# Patient Record
Sex: Female | Born: 1985 | Race: White | Hispanic: Yes | Marital: Married | Smoking: Never smoker
Health system: Southern US, Community
[De-identification: ages and names within clinical notes are randomized; demographics above are authoritative.]

## PROBLEM LIST (undated history)

## (undated) DIAGNOSIS — K828 Other specified diseases of gallbladder: Secondary | ICD-10-CM

## (undated) DIAGNOSIS — A749 Chlamydial infection, unspecified: Secondary | ICD-10-CM

## (undated) HISTORY — DX: Other specified diseases of gallbladder: K82.8

## (undated) HISTORY — PX: NO PAST SURGERIES: SHX2092

## (undated) HISTORY — DX: Chlamydial infection, unspecified: A74.9

---
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
## 2008-03-02 ENCOUNTER — Emergency Department (HOSPITAL_COMMUNITY): Admission: EM | Admit: 2008-03-02 | Discharge: 2008-03-02 | Payer: Self-pay | Admitting: Internal Medicine

## 2008-03-03 ENCOUNTER — Emergency Department (HOSPITAL_COMMUNITY): Admission: EM | Admit: 2008-03-03 | Discharge: 2008-03-03 | Payer: Self-pay | Admitting: Emergency Medicine

## 2010-06-18 HISTORY — PX: OTHER SURGICAL HISTORY: SHX169

## 2010-06-18 NOTE — L&D Delivery Note (Signed)
Delivery Note At 3:11 AM a viable female was delivered via Vaginal, Spontaneous Delivery (Presentation:LOA ;  ).  APGAR: , ; weight .   Placenta status Spont via shultz: , .  Cord: 3VC.  Anesthesia: NONE  Episiotomy: NONE Lacerations: NONE Suture Repair:  Est. Blood Loss350 (mL):   Mom to postpartum.  Baby to nursery-stable.  Zerita Boers 02/20/2011, 3:19 AM

## 2010-09-11 ENCOUNTER — Emergency Department (HOSPITAL_COMMUNITY)
Admission: EM | Admit: 2010-09-11 | Discharge: 2010-09-11 | Disposition: A | Discharge status: Home / Self Care | Payer: Medicaid Other | Attending: Emergency Medicine | Admitting: Emergency Medicine

## 2010-09-11 ENCOUNTER — Emergency Department (HOSPITAL_COMMUNITY): Payer: Medicaid Other

## 2010-09-11 DIAGNOSIS — B9689 Other specified bacterial agents as the cause of diseases classified elsewhere: Secondary | ICD-10-CM | POA: Insufficient documentation

## 2010-09-11 DIAGNOSIS — O2390 Unspecified genitourinary tract infection in pregnancy, unspecified trimester: Secondary | ICD-10-CM | POA: Insufficient documentation

## 2010-09-11 DIAGNOSIS — A499 Bacterial infection, unspecified: Secondary | ICD-10-CM | POA: Insufficient documentation

## 2010-09-11 DIAGNOSIS — N760 Acute vaginitis: Secondary | ICD-10-CM | POA: Insufficient documentation

## 2010-09-11 DIAGNOSIS — R109 Unspecified abdominal pain: Secondary | ICD-10-CM | POA: Insufficient documentation

## 2010-09-11 LAB — URINALYSIS, ROUTINE W REFLEX MICROSCOPIC
Protein, ur: NEGATIVE mg/dL
Specific Gravity, Urine: 1.027 (ref 1.005–1.030)
Urobilinogen, UA: 0.2 mg/dL (ref 0.0–1.0)

## 2010-09-11 LAB — WET PREP, GENITAL
Trich, Wet Prep: NONE SEEN
Yeast Wet Prep HPF POC: NONE SEEN

## 2010-09-12 LAB — GC/CHLAMYDIA PROBE AMP, GENITAL
Chlamydia, DNA Probe: NEGATIVE
GC Probe Amp, Genital: NEGATIVE

## 2010-09-12 LAB — URINE CULTURE

## 2010-09-17 ENCOUNTER — Inpatient Hospital Stay (HOSPITAL_COMMUNITY)
Admission: AD | Admit: 2010-09-17 | Payer: Medicaid Other | Source: Ambulatory Visit | Admitting: Obstetrics and Gynecology

## 2010-09-20 ENCOUNTER — Other Ambulatory Visit: Payer: Self-pay | Admitting: Family Medicine

## 2010-09-20 DIAGNOSIS — O0930 Supervision of pregnancy with insufficient antenatal care, unspecified trimester: Secondary | ICD-10-CM

## 2010-09-20 DIAGNOSIS — Z1389 Encounter for screening for other disorder: Secondary | ICD-10-CM

## 2010-09-20 LAB — ANTIBODY SCREEN: Antibody Screen: NEGATIVE

## 2010-09-20 LAB — RPR: RPR: NONREACTIVE

## 2010-09-20 LAB — ABO/RH

## 2010-09-20 LAB — RUBELLA ANTIBODY, IGM: Rubella: IMMUNE

## 2010-09-21 ENCOUNTER — Other Ambulatory Visit: Payer: Self-pay | Admitting: Family Medicine

## 2010-09-21 ENCOUNTER — Ambulatory Visit (HOSPITAL_COMMUNITY)
Admission: RE | Admit: 2010-09-21 | Discharge: 2010-09-21 | Disposition: A | Discharge status: Home / Self Care | Payer: Medicaid Other | Source: Ambulatory Visit | Attending: Family Medicine | Admitting: Family Medicine

## 2010-09-21 DIAGNOSIS — Z1389 Encounter for screening for other disorder: Secondary | ICD-10-CM

## 2010-09-21 DIAGNOSIS — Z3689 Encounter for other specified antenatal screening: Secondary | ICD-10-CM | POA: Insufficient documentation

## 2010-09-21 DIAGNOSIS — O0930 Supervision of pregnancy with insufficient antenatal care, unspecified trimester: Secondary | ICD-10-CM | POA: Insufficient documentation

## 2010-11-21 ENCOUNTER — Ambulatory Visit: Payer: Medicaid Other | Admitting: Gastroenterology

## 2010-12-05 ENCOUNTER — Ambulatory Visit (INDEPENDENT_AMBULATORY_CARE_PROVIDER_SITE_OTHER): Payer: Self-pay | Admitting: Gastroenterology

## 2010-12-05 ENCOUNTER — Encounter: Payer: Self-pay | Admitting: Gastroenterology

## 2010-12-05 ENCOUNTER — Other Ambulatory Visit (INDEPENDENT_AMBULATORY_CARE_PROVIDER_SITE_OTHER): Payer: Self-pay

## 2010-12-05 VITALS — BP 124/68 | HR 88 | Ht 62.0 in | Wt 151.0 lb

## 2010-12-05 DIAGNOSIS — R7989 Other specified abnormal findings of blood chemistry: Secondary | ICD-10-CM

## 2010-12-05 LAB — COMPREHENSIVE METABOLIC PANEL
ALT: 30 U/L (ref 0–35)
AST: 22 U/L (ref 0–37)
Albumin: 3.3 g/dL — ABNORMAL LOW (ref 3.5–5.2)
Alkaline Phosphatase: 88 U/L (ref 39–117)
BUN: 8 mg/dL (ref 6–23)
Calcium: 9 mg/dL (ref 8.4–10.5)
Chloride: 106 mEq/L (ref 96–112)
Creatinine, Ser: 0.3 mg/dL — ABNORMAL LOW (ref 0.4–1.2)
Glucose, Bld: 72 mg/dL (ref 70–99)
Total Bilirubin: 0.1 mg/dL — ABNORMAL LOW (ref 0.3–1.2)

## 2010-12-05 LAB — CBC WITH DIFFERENTIAL/PLATELET
Basophils Absolute: 0 10*3/uL (ref 0.0–0.1)
Eosinophils Absolute: 0.3 10*3/uL (ref 0.0–0.7)
HCT: 31 % — ABNORMAL LOW (ref 36.0–46.0)
Hemoglobin: 10.9 g/dL — ABNORMAL LOW (ref 12.0–15.0)
Lymphs Abs: 1.7 10*3/uL (ref 0.7–4.0)
MCHC: 35 g/dL (ref 30.0–36.0)
MCV: 91.4 fl (ref 78.0–100.0)
Monocytes Absolute: 0.5 10*3/uL (ref 0.1–1.0)
Monocytes Relative: 5.2 % (ref 3.0–12.0)
Neutro Abs: 7.1 10*3/uL (ref 1.4–7.7)
Platelets: 248 10*3/uL (ref 150.0–400.0)
RDW: 13.2 % (ref 11.5–14.6)

## 2010-12-05 NOTE — Patient Instructions (Addendum)
You will be set up for an ultrasound.  [facility]  12/08/10  930 am arrive 915 am nothing to eat or drink after midnight. You will get labs drawn today:  Hepatitis A (IgM and IgG), total iron, ferritin, TIBC, ANA, AMA, alphafeto protein (AFP), anti smooth muscle antibody, alpha 1 antitrypsin, cerulloplasm, CBC, CMET, INR. Will probably need to follow liver tests every 3-4 works during pregnancy. A copy of this information will be made available to Dr. Shawnie Pons. Will look into your 2009 hepatitis episode for more details.

## 2010-12-05 NOTE — Progress Notes (Signed)
HPI: This is a  very pleasant 25 year old woman who is here for evaluation of elevated transaminases.  AST 47 ALT 103 10/12/2010  Second pregnancy, currently 28 weeks.  Unknown sex.  In 2009, she went to ER with right sided pains.  Very elevated liver tests, transaminases both in the 500s..  US showed GB sludge.  Since then no problems.  She never had dedicated liver follow up after that 2009 episode.  She thinks she had hep C back then.  I reviewed her emergency room visits and I cannot see any notation where hepatitis viral serologies were checked.   No problems during this pregnancy.    preg 2006 baby girl, turning 6 soon.  No liver troubles in family.   Used to drink a lot on weekends, no in a very long time. Was no an alcoholic.  Review of systems: Pertinent positive and negative review of systems were noted in the above HPI section.  All other review of systems was otherwise negative.   Past Medical History  Diagnosis Date  . Gallbladder sludge     No past surgical history on file.   reports that she has quit smoking. She has never used smokeless tobacco. She reports that she does not drink alcohol or use illicit drugs.  family history includes Diabetes in her sister.  There is no history of Colon cancer.    Current Medications, Allergies were all reviewed with the patient via [facility] electronic medical record system.    Physical Exam: BP 124/68  Pulse 88  Ht 5\' 2"  (1.575 m)  Wt 151 lb (68.493 kg)  BMI 27.62 kg/m2 Constitutional: generally well-appearing Psychiatric: alert and oriented x3 Eyes: extraocular movements intact Mouth: oral pharynx moist, no lesions Neck: supple no lymphadenopathy Cardiovascular: heart regular rate and rhythm Lungs: clear to auscultation bilaterally Abdomen: soft, nontender, nondistended, no obvious ascites, no peritoneal signs, normal bowel sounds Extremities: no lower extremity edema bilaterally Skin: no lesions on visible  extremities    Assessment and plan: 25 y.o. female with history of hepatitis, currently slightly elevated liver tests while pregnant  Clinically she feels fine, she has no biliary symptoms or hepatitis symptoms. Her liver tests are only very minor early elevated as of 6 weeks ago. We will recheck those today and will also order a battery of other tests to determine the cause of her elevated liver tests. It is not clear to me what type of hepatitis she had in 2009. I see no documentation of hepatitis a, hepatitis C testing. She did have hepatitis B. surface antigen checked at her gynecologist and it was negative. She will also get a new abdominal ultrasound.

## 2010-12-06 LAB — FERRITIN: Ferritin: 8.7 ng/mL — ABNORMAL LOW (ref 10.0–291.0)

## 2010-12-06 LAB — ALPHA-1-ANTITRYPSIN: A-1 Antitrypsin, Ser: 229 mg/dL — ABNORMAL HIGH (ref 90–200)

## 2010-12-06 LAB — CERULOPLASMIN: Ceruloplasmin: 57 mg/dL (ref 20–60)

## 2010-12-08 ENCOUNTER — Other Ambulatory Visit (HOSPITAL_COMMUNITY): Payer: Self-pay

## 2010-12-11 ENCOUNTER — Telehealth: Payer: Self-pay

## 2010-12-11 NOTE — Telephone Encounter (Signed)
Unable to reach pt by phone letter mailed.  

## 2010-12-11 NOTE — Telephone Encounter (Signed)
Message copied by Donata Duff on Mon Dec 11, 2010  9:12 AM ------      Message from: Rob Bunting P      Created: Thu Dec 07, 2010  9:25 AM                   Please call the patient.  The labs were all essentially normal. (liver tests now normal).  Needs repeat LFTs in 2 weeks.

## 2010-12-12 ENCOUNTER — Ambulatory Visit (HOSPITAL_COMMUNITY)
Admission: RE | Admit: 2010-12-12 | Discharge: 2010-12-12 | Disposition: A | Discharge status: Home / Self Care | Payer: Self-pay | Source: Ambulatory Visit | Attending: Gastroenterology | Admitting: Gastroenterology

## 2010-12-12 DIAGNOSIS — R7989 Other specified abnormal findings of blood chemistry: Secondary | ICD-10-CM | POA: Insufficient documentation

## 2010-12-12 DIAGNOSIS — O99891 Other specified diseases and conditions complicating pregnancy: Secondary | ICD-10-CM | POA: Insufficient documentation

## 2011-01-25 LAB — STREP B DNA PROBE: GBS: NEGATIVE

## 2011-02-13 ENCOUNTER — Telehealth: Payer: Self-pay | Admitting: Gastroenterology

## 2011-02-13 ENCOUNTER — Encounter: Payer: Self-pay | Admitting: Gastroenterology

## 2011-02-13 NOTE — Telephone Encounter (Signed)
Error

## 2011-02-13 NOTE — Telephone Encounter (Signed)
Pt was called and I was instructed the phone number was not the correct number for the pt.

## 2011-02-20 ENCOUNTER — Inpatient Hospital Stay (HOSPITAL_COMMUNITY)
Admission: AD | Admit: 2011-02-20 | Discharge: 2011-02-21 | DRG: 775 | Disposition: A | Discharge status: Home / Self Care | Payer: Medicaid Other | Source: Ambulatory Visit | Attending: Obstetrics & Gynecology | Admitting: Obstetrics & Gynecology

## 2011-02-20 ENCOUNTER — Encounter (HOSPITAL_COMMUNITY): Payer: Self-pay | Admitting: *Deleted

## 2011-02-20 LAB — CBC
HCT: 36.3 % (ref 36.0–46.0)
Hemoglobin: 12.1 g/dL (ref 12.0–15.0)
RBC: 4.03 MIL/uL (ref 3.87–5.11)
RDW: 13.7 % (ref 11.5–15.5)
WBC: 8.5 10*3/uL (ref 4.0–10.5)

## 2011-02-20 MED ORDER — OXYCODONE-ACETAMINOPHEN 5-325 MG PO TABS
1.0000 | ORAL_TABLET | ORAL | Status: DC | PRN
  Filled 2011-02-20: qty 2

## 2011-02-20 MED ORDER — OXYTOCIN BOLUS FROM INFUSION
500.0000 mL | Freq: Once | INTRAVENOUS | Status: DC
  Filled 2011-02-20: qty 500
  Filled 2011-02-20: qty 1000

## 2011-02-20 MED ORDER — OXYTOCIN 20 UNITS IN LACTATED RINGERS INFUSION - SIMPLE
125.0000 mL/h | Freq: Once | INTRAVENOUS | Status: DC
  Administered 2011-02-20: 999 mL/h via INTRAVENOUS

## 2011-02-20 MED ORDER — ZOLPIDEM TARTRATE 5 MG PO TABS: 5.0000 mg | ORAL_TABLET | Freq: Every evening | ORAL | Status: DC | PRN

## 2011-02-20 MED ORDER — DIPHENHYDRAMINE HCL 25 MG PO CAPS: 25.0000 mg | ORAL_CAPSULE | Freq: Four times a day (QID) | ORAL | Status: DC | PRN

## 2011-02-20 MED ORDER — LIDOCAINE HCL (PF) 1 % IJ SOLN
30.0000 mL | INTRAMUSCULAR | Status: DC | PRN
  Filled 2011-02-20: qty 30

## 2011-02-20 MED ORDER — BENZOCAINE-MENTHOL 20-0.5 % EX AERO: 1.0000 "application " | INHALATION_SPRAY | CUTANEOUS | Status: DC | PRN

## 2011-02-20 MED ORDER — ACETAMINOPHEN 325 MG PO TABS: 650.0000 mg | ORAL_TABLET | ORAL | Status: DC | PRN

## 2011-02-20 MED ORDER — PRENATAL PLUS 27-1 MG PO TABS
1.0000 | ORAL_TABLET | Freq: Every day | ORAL | Status: DC
  Administered 2011-02-20 – 2011-02-21 (×2): 1 via ORAL
  Filled 2011-02-20 (×2): qty 1

## 2011-02-20 MED ORDER — CITRIC ACID-SODIUM CITRATE 334-500 MG/5ML PO SOLN: 30.0000 mL | ORAL | Status: DC | PRN

## 2011-02-20 MED ORDER — FLEET ENEMA 7-19 GM/118ML RE ENEM: 1.0000 | ENEMA | RECTAL | Status: DC | PRN

## 2011-02-20 MED ORDER — SENNOSIDES-DOCUSATE SODIUM 8.6-50 MG PO TABS
2.0000 | ORAL_TABLET | Freq: Every day | ORAL | Status: DC
  Administered 2011-02-20: 2 via ORAL

## 2011-02-20 MED ORDER — NALBUPHINE SYRINGE 5 MG/0.5 ML: 10.0000 mg | INJECTION | INTRAMUSCULAR | Status: DC | PRN

## 2011-02-20 MED ORDER — LACTATED RINGERS IV SOLN: 500.0000 mL | INTRAVENOUS | Status: DC | PRN

## 2011-02-20 MED ORDER — IBUPROFEN 600 MG PO TABS: 600.0000 mg | ORAL_TABLET | Freq: Four times a day (QID) | ORAL | Status: DC | PRN

## 2011-02-20 MED ORDER — TETANUS-DIPHTH-ACELL PERTUSSIS 5-2.5-18.5 LF-MCG/0.5 IM SUSP
0.5000 mL | Freq: Once | INTRAMUSCULAR | Status: AC
  Administered 2011-02-21: 0.5 mL via INTRAMUSCULAR
  Filled 2011-02-20: qty 0.5

## 2011-02-20 MED ORDER — OXYCODONE-ACETAMINOPHEN 5-325 MG PO TABS
2.0000 | ORAL_TABLET | ORAL | Status: DC | PRN
Start: 2011-02-20 — End: 2011-02-20

## 2011-02-20 MED ORDER — PRE-NATAL FORMULA PO TABS: ORAL_TABLET | Freq: Every day | ORAL | Status: DC

## 2011-02-20 MED ORDER — WITCH HAZEL-GLYCERIN EX PADS: 1.0000 "application " | MEDICATED_PAD | CUTANEOUS | Status: DC | PRN

## 2011-02-20 MED ORDER — ONDANSETRON HCL 4 MG PO TABS: 4.0000 mg | ORAL_TABLET | ORAL | Status: DC | PRN

## 2011-02-20 MED ORDER — ONDANSETRON HCL 4 MG/2ML IJ SOLN: 4.0000 mg | Freq: Four times a day (QID) | INTRAMUSCULAR | Status: DC | PRN

## 2011-02-20 MED ORDER — IBUPROFEN 600 MG PO TABS
600.0000 mg | ORAL_TABLET | Freq: Four times a day (QID) | ORAL | Status: DC
  Administered 2011-02-20 – 2011-02-21 (×6): 600 mg via ORAL
  Filled 2011-02-20 (×8): qty 1

## 2011-02-20 MED ORDER — LACTATED RINGERS IV SOLN
INTRAVENOUS | Status: DC
  Administered 2011-02-20: 03:00:00 via INTRAVENOUS

## 2011-02-20 MED ORDER — LANOLIN HYDROUS EX OINT: TOPICAL_OINTMENT | CUTANEOUS | Status: DC | PRN

## 2011-02-20 MED ORDER — SIMETHICONE 80 MG PO CHEW: 80.0000 mg | CHEWABLE_TABLET | ORAL | Status: DC | PRN

## 2011-02-20 MED ORDER — DIBUCAINE 1 % RE OINT: 1.0000 "application " | TOPICAL_OINTMENT | RECTAL | Status: DC | PRN

## 2011-02-20 MED ORDER — ONDANSETRON HCL 4 MG/2ML IJ SOLN: 4.0000 mg | INTRAMUSCULAR | Status: DC | PRN

## 2011-02-20 NOTE — Progress Notes (Signed)
Pt reports contractions q 10 minutes, blood on tissue when she wipes

## 2011-02-20 NOTE — H&P (Signed)
Mandee Pluta Ruiz-Garcia is a 25 y.o. female presenting for labor. Maternal Medical History:  Reason for admission: Reason for admission: contractions.  Contractions: Onset was 3-5 hours ago.   Frequency: regular.   Perceived severity is strong.    Fetal activity: Perceived fetal activity is normal.   Last perceived fetal movement was within the past hour.    Prenatal complications: no prenatal complications   OB History    Grav Para Term Preterm Abortions TAB SAB Ect Mult Living   2 1        1      Past Medical History  Diagnosis Date  . Gallbladder sludge    History reviewed. No pertinent past surgical history. Family History: family history includes Diabetes in her sister.  There is no history of Colon cancer. Social History:  reports that she has never smoked. She has never used smokeless tobacco. She reports that she does not drink alcohol or use illicit drugs.  Review of Systems  All other systems reviewed and are negative.    Dilation: 5 Effacement (%): 90 Station: -1 Exam by:: Weston,RN Blood pressure 124/85, pulse 84, temperature 97.9 F (36.6 C), temperature source Oral, resp. rate 18, height 5\' 5"  (1.651 m), weight 71.215 kg (157 lb). Maternal Exam:  Uterine Assessment: Contraction strength is firm.  Contraction frequency is regular.   Abdomen: Patient reports no abdominal tenderness. Fundal height is 8lbs.   Fetal presentation: vertex  Introitus: Normal vulva. Normal vagina.    Physical Exam  Vitals reviewed. Constitutional: She is oriented to person, place, and time. She appears well-developed and well-nourished.  HENT:  Head: Normocephalic.  Neck: Normal range of motion.  Cardiovascular: Normal rate, regular rhythm, normal heart sounds and intact distal pulses.   Respiratory: Effort normal and breath sounds normal.  GI: Soft. Bowel sounds are normal.  Genitourinary: Vagina normal.  Musculoskeletal: Normal range of motion.  Neurological: She is alert and  oriented to person, place, and time. She has normal reflexes.  Skin: Skin is warm and dry.  Psychiatric: She has a normal mood and affect. Her behavior is normal. Judgment and thought content normal.    Prenatal labs: ABO, Rh: B/Positive/-- (04/04 0000) Antibody: Negative (04/04 0000) Rubella:   RPR: Nonreactive (04/04 0000)  HBsAg: Negative (04/04 0000)  HIV: Non-reactive (06/14 0000)  GBS: Negative (08/09 0000)   Assessment/Plan: Admit anticipate vag delivery.   Zerita Boers 02/20/2011, 2:24 AM

## 2011-02-20 NOTE — Progress Notes (Signed)
UR Chart review completed.  

## 2011-02-21 MED ORDER — IBUPROFEN 600 MG PO TABS: 600.0000 mg | ORAL_TABLET | Freq: Four times a day (QID) | ORAL | Status: AC

## 2011-02-21 MED ORDER — LANOLIN HYDROUS EX OINT: 1.0000 "application " | TOPICAL_OINTMENT | CUTANEOUS | Status: DC | PRN

## 2011-02-21 MED ORDER — NORETHINDRONE 0.35 MG PO TABS: 1.0000 | ORAL_TABLET | Freq: Every day | ORAL | Status: DC

## 2011-02-21 NOTE — Discharge Summary (Signed)
Obstetric Discharge Summary Reason for Admission: onset of labor Prenatal Procedures: none Intrapartum Procedures: spontaneous vaginal delivery Postpartum Procedures: none Complications-Operative and Postpartum: none Hemoglobin  Date Value Range Status  02/20/2011 12.1  12.0-15.0 (g/dL) Final     HCT  Date Value Range Status  02/20/2011 36.3  36.0-46.0 (%) Final    Discharge Diagnoses: Term Pregnancy-delivered  Discharge Information: Date: 02/21/2011 Activity: pelvic rest Diet: routine Medications: PNV and Ortho micronor Condition: stable Instructions: refer to practice specific booklet Discharge to: home Follow-up Information    Follow up with [facility]. Call in 6 weeks.         Newborn Data: Live born female  Birth Weight: 7 lb 12.2 oz (3521 g) APGAR: 9, 9  Home with mother.  Jayland Null 02/21/2011, 2:32 PM

## 2011-02-21 NOTE — Progress Notes (Signed)
  Subjective:    Patient ID: Summer Wilkinson, female    DOB: Feb 16, 1986, 25 y.o.   MRN: [id number]  HPI    Review of Systems     Objective:   Physical Exam        Assessment & Plan:   Post Partum Day 1 Subjective: no complaints  Objective: Blood pressure 107/66, pulse 73, temperature 97.3 F (36.3 C), temperature source Oral, resp. rate 19, height 5\' 5"  (1.651 m), weight 157 lb (71.215 kg), SpO2 97.00%, unknown if currently breastfeeding.  Physical Exam:  General: alert and no distress Lochia: appropriate Uterine Fundus: firm Incision: n/a DVT Evaluation: No evidence of DVT seen on physical exam. No significant calf/ankle edema.   Basename 02/20/11 0239  HGB 12.1  HCT 36.3    Assessment/Plan: Plan for discharge tomorrow or this afternoon if baby cleared for early discharge, Breastfeeding and Contraception ortho micronor.    LOS: 1 day   Mirranda Monrroy 02/21/2011, 9:13 AM

## 2011-02-26 NOTE — H&P (Signed)
Attestation of Attending Supervision of Advanced Practitioner: Evaluation and management procedures were performed by the PA/NP/CNM/OB Fellow under my supervision/collaboration. Chart reviewed and agree with management and plan.  Bartosz Luginbill A 02/26/2011 9:54 PM   

## 2011-03-19 LAB — COMPREHENSIVE METABOLIC PANEL
ALT: 598 — ABNORMAL HIGH
AST: 517 — ABNORMAL HIGH
Albumin: 4.1
Calcium: 9.3
GFR calc Af Amer: >60
Potassium: 4
Sodium: 140
Total Protein: 6.6

## 2011-03-19 LAB — URINE MICROSCOPIC-ADD ON

## 2011-03-19 LAB — CBC
MCHC: 33.6
Platelets: 257
RDW: 13.8

## 2011-03-19 LAB — URINALYSIS, ROUTINE W REFLEX MICROSCOPIC
Bilirubin Urine: NEGATIVE
Nitrite: NEGATIVE
Protein, ur: 100 — AB
Specific Gravity, Urine: 1.028
Urobilinogen, UA: 1

## 2011-03-19 LAB — DIFFERENTIAL
Basophils Absolute: 0
Basophils Relative: 1
Lymphocytes Relative: 31
Neutro Abs: 2.5
Neutrophils Relative %: 49

## 2011-03-19 LAB — LIPASE, BLOOD: Lipase: 41

## 2011-03-19 LAB — POCT PREGNANCY, URINE: Preg Test, Ur: NEGATIVE

## 2011-03-19 LAB — HEPATITIS B SURFACE ANTIGEN: Hepatitis B Surface Ag: NEGATIVE

## 2011-07-22 ENCOUNTER — Encounter (HOSPITAL_COMMUNITY): Payer: Self-pay | Admitting: Emergency Medicine

## 2011-07-22 ENCOUNTER — Emergency Department (HOSPITAL_COMMUNITY)
Admission: EM | Admit: 2011-07-22 | Discharge: 2011-07-22 | Disposition: A | Discharge status: Home / Self Care | Payer: Self-pay | Attending: Emergency Medicine | Admitting: Emergency Medicine

## 2011-07-22 ENCOUNTER — Emergency Department (HOSPITAL_COMMUNITY): Payer: Self-pay

## 2011-07-22 ENCOUNTER — Other Ambulatory Visit: Payer: Self-pay

## 2011-07-22 DIAGNOSIS — R4182 Altered mental status, unspecified: Secondary | ICD-10-CM | POA: Insufficient documentation

## 2011-07-22 DIAGNOSIS — F10929 Alcohol use, unspecified with intoxication, unspecified: Secondary | ICD-10-CM

## 2011-07-22 DIAGNOSIS — F1010 Alcohol abuse, uncomplicated: Secondary | ICD-10-CM | POA: Insufficient documentation

## 2011-07-22 LAB — COMPREHENSIVE METABOLIC PANEL
ALT: 24 U/L (ref 0–35)
CO2: 24 mEq/L (ref 19–32)
Calcium: 9.4 mg/dL (ref 8.4–10.5)
GFR calc Af Amer: >90 mL/min (ref >90)
GFR calc non Af Amer: >90 mL/min (ref >90)
Glucose, Bld: 105 mg/dL — ABNORMAL HIGH (ref 70–99)
Sodium: 140 mEq/L (ref 135–145)
Total Bilirubin: 0.2 mg/dL — ABNORMAL LOW (ref 0.3–1.2)

## 2011-07-22 LAB — URINALYSIS, ROUTINE W REFLEX MICROSCOPIC
Bilirubin Urine: NEGATIVE
Ketones, ur: NEGATIVE mg/dL
Nitrite: NEGATIVE
Protein, ur: NEGATIVE mg/dL
Specific Gravity, Urine: 1.017 (ref 1.005–1.030)
Urobilinogen, UA: 0.2 mg/dL (ref 0.0–1.0)

## 2011-07-22 LAB — ETHANOL: Alcohol, Ethyl (B): 159 mg/dL — ABNORMAL HIGH (ref 0–11)

## 2011-07-22 LAB — POCT PREGNANCY, URINE: Preg Test, Ur: NEGATIVE

## 2011-07-22 LAB — CBC
Hemoglobin: 11.9 g/dL — ABNORMAL LOW (ref 12.0–15.0)
MCH: 29 pg (ref 26.0–34.0)
MCV: 89.3 fL (ref 78.0–100.0)
RBC: 4.11 MIL/uL (ref 3.87–5.11)

## 2011-07-22 LAB — RAPID URINE DRUG SCREEN, HOSP PERFORMED
Barbiturates: NOT DETECTED
Cocaine: NOT DETECTED

## 2011-07-22 MED ORDER — LORAZEPAM 2 MG/ML IJ SOLN
INTRAMUSCULAR | Status: AC
  Administered 2011-07-22: 2 mg via INTRAVENOUS
  Filled 2011-07-22: qty 1

## 2011-07-22 MED ORDER — LORAZEPAM 2 MG/ML IJ SOLN
1.0000 mg | Freq: Once | INTRAMUSCULAR | Status: DC
  Administered 2011-07-22: 2 mg via INTRAVENOUS

## 2011-07-22 MED ORDER — LORAZEPAM 2 MG/ML IJ SOLN: 1.0000 mg | Freq: Once | INTRAMUSCULAR | Status: DC

## 2011-07-22 MED ORDER — SODIUM CHLORIDE 0.9 % IV BOLUS (SEPSIS)
1000.0000 mL | Freq: Once | INTRAVENOUS | Status: AC
  Administered 2011-07-22: 1000 mL via INTRAVENOUS

## 2011-07-22 NOTE — ED Provider Notes (Signed)
History     CSN: [id number]  Arrival date & time 07/22/11  0557   First MD Initiated Contact with Patient 07/22/11 [phone]      Chief Complaint  Patient presents with  . Alcohol Intoxication    (Consider location/radiation/quality/duration/timing/severity/associated sxs/prior treatment) HPI A LEVEL 5 CAVEAT PERTAINS DUE TO ALTERED MENTAL STATUS Patient presenting via EMS with altered mental status after drinking alcohol tonight. Per EMS and brother at bedside patient had 5 margaritas tonight. She then began to become less responsive and had one episode of emesis. Her brother states that they put her in a shower to try to awaken her but when this did not help 911 was called.  Pt is not able to contribute to history due to altered mental status  History reviewed. No pertinent past medical history.  No past surgical history on file.  No family history on file.  History  Substance Use Topics  . Smoking status: Not on file  . Smokeless tobacco: Not on file  . Alcohol Use: Yes    OB History    Grav Para Term Preterm Abortions TAB SAB Ect Mult Living                  Review of Systems UNABLE TO OBTAIN ROS DUE TO ALTERED MENTAL STATUS  Allergies  Review of patient's allergies indicates no known allergies.  Home Medications  No current outpatient prescriptions on file.  BP 93/76  Pulse 86  Temp(Src) 97.8 F (36.6 C) (Oral)  Resp 16  SpO2 98% temp 96.8 (rectal) Vital signs reviewed Physical Exam Physical Examination: General appearance - somnolent, responding purposefully to painful stimuli and occasionally to verbal stimuli, in mild distress Mental status - intermittently awake, then somnolent- not oriented to person, place or time Eyes - pupils equal and reactive, extraocular eye movements intact Mouth - mucous membranes moist, pharynx normal without lesions Chest - airway intact, no increaed respiratory effort, clear to auscultation, no wheezes, rales or rhonchi,  symmetric air entry Heart - normal rate, regular rhythm, normal S1, S2, no murmurs, rubs, clicks or gallops Abdomen - soft, nontender, nondistended, no masses or organomegaly Neurological - somnolent, decreased responsiveness but responds purposefully to noxious stimilu, moving all extremities x 4, PERRL, intermittent tonic/clonic activity of extremities x 4 with jaw clenching Musculoskeletal - no joint tenderness, deformity or swelling Extremities - peripheral pulses normal, no pedal edema, no clubbing or cyanosis Skin - normal coloration and turgor, no rashes, brisk cap refill with symettric pulses  ED Course  Procedures (including critical care time)   Date: 07/22/2011  Rate: 85  Rhythm: normal sinus rhythm  QRS Axis: normal  Intervals: normal  ST/T Wave abnormalities: normal  Conduction Disutrbances:none  Narrative Interpretation:   Old EKG Reviewed: none available  CRITICAL CARE Performed by: Ethelda Chick   Total critical care time: 35  Critical care time was exclusive of separately billable procedures and treating other patients.  Critical care was necessary to treat or prevent imminent or life-threatening deterioration.  Critical care was time spent personally by me on the following activities: development of treatment plan with patient and/or surrogate as well as nursing, discussions with consultants, evaluation of patient's response to treatment, examination of patient, obtaining history from patient or surrogate, ordering and performing treatments and interventions, ordering and review of laboratory studies, ordering and review of radiographic studies, pulse oximetry and re-evaluation of patient's condition.     Labs Reviewed  CBC - Abnormal; Notable for the following:  Hemoglobin 11.9 (*)    All other components within normal limits  COMPREHENSIVE METABOLIC PANEL - Abnormal; Notable for the following:    Potassium 3.2 (*)    Glucose, Bld 105 (*)     Creatinine, Ser 0.43 (*)    Total Bilirubin 0.2 (*)    All other components within normal limits  ETHANOL - Abnormal; Notable for the following:    Alcohol, Ethyl (B) 159 (*)    All other components within normal limits  URINALYSIS, ROUTINE W REFLEX MICROSCOPIC  URINE RAPID DRUG SCREEN (HOSP PERFORMED)  POCT PREGNANCY, URINE   Ct Head Wo Contrast  07/22/2011  *RADIOLOGY REPORT*  Clinical Data: EtOH intoxication.  CT HEAD WITHOUT CONTRAST  Technique:  Contiguous axial images were obtained from the base of the skull through the vertex without contrast.  Comparison: 12/07/2002 report  Findings: There is no evidence for acute hemorrhage, hydrocephalus, mass lesion, or abnormal extra-axial fluid collection.  No definite CT evidence for acute infarction.  Partially opacified right greater than left ethmoid air cells.  The visualized mastoid air cells are clear.  IMPRESSION: No acute intracranial abnormality.  Partially opacified ethmoid air cells.  Correlate clinically if concerned for acute sinusitis.  Original Report Authenticated By: Waneta Martins, M.D.   Dg Chest Port 1 View  07/22/2011  *RADIOLOGY REPORT*  Clinical Data: Vomiting, EtOH  PORTABLE CHEST - 1 VIEW  Comparison: None.  Findings: Mild hypoaeration results in interstitial and vascular crowding.  The lower lobes are obscured by associated hemidiaphragm elevation.  Otherwise, no consolidation identified.  No pneumothorax.  Cardiomediastinal contours within normal limits.  No acute osseous abnormality.  IMPRESSION: Hypoaeration results in interstitial crowding and elevated hemidiaphragms which obscures the lung bases.  Otherwise, no acute process identified.  Consider PA and lateral radiographs when the patient can tolerate to better evaluate.  Original Report Authenticated By: Waneta Martins, M.D.     1. Acute alcohol intoxication       MDM  Pt presenting with altered mental status after drinking alcohol tonight. The patient was  seen immediately upon arrival to 2 significantly depressed mental status. IV access was obtained, she was placed on monitor, oxygen was given. A nasal trumpet was inserted into her right Sinda Du which did produce response from the patient she immediately pulled the nasal Out. Patient also appeared to be having seizure-like activity with tonic-clonic movement of 4 extremities. Her airway was patent with no signs of respiratory distress. She had one episode of emesis which was quickly suctioned from her mouth. She was given Ativan IV for possible seizure activity. Patient is intermittently awake and somewhat responsive however not responding appropriately to questions and speaking in gibberish. A head CT was obtained which was normal. Portable chest x-ray was also obtained due to concern for possible aspiration. Chest x-ray was reassuring. Her lab workup revealed elevated alcohol level. Mildly reduced potassium. Her urine drug screen was negative. Family at bedside denies any history of seizures and denies any use of other substances besides drinking 5 margaritas this evening. EKG showed normal intervals.  Rectal temp was normal.  Patient was reassessed multiple times and continued to maintain her airway but also continued to be significantly somnolent.        Ethelda Chick, MD 07/22/11 [phone]

## 2011-07-22 NOTE — ED Notes (Signed)
Nasal trumpet inserted in the r nare per Dewayne Hatch RN and the patient promptly reached up and pulled it out.  Patient awake at intervals and then lapses back to sleep from etoh

## 2011-07-22 NOTE — ED Provider Notes (Signed)
Patient is awake, alert, offering her name, and is appropriately oriented.  She will be d/c w (3) family members.  Gerhard Munch, MD 07/22/11 1050

## 2011-07-22 NOTE — ED Notes (Signed)
Per EMS sts that the patient had many margaritas this pm with friends- started vomiting and they placed the patient in the shower.

## 2011-07-22 NOTE — ED Notes (Signed)
MD @ bedside.   Patient appears to be having some type of seizure with tonic/ clonic activity noted with arms and legs.  Ativan ordered and given per [employer] Rn

## 2011-07-23 LAB — GLUCOSE, CAPILLARY: Glucose-Capillary: 121 mg/dL — ABNORMAL HIGH (ref 70–99)

## 2011-10-31 ENCOUNTER — Emergency Department (HOSPITAL_COMMUNITY)
Admission: EM | Admit: 2011-10-31 | Discharge: 2011-10-31 | Disposition: A | Discharge status: Home / Self Care | Payer: Self-pay | Attending: Emergency Medicine | Admitting: Emergency Medicine

## 2011-10-31 ENCOUNTER — Encounter (HOSPITAL_COMMUNITY): Payer: Self-pay | Admitting: *Deleted

## 2011-10-31 DIAGNOSIS — R339 Retention of urine, unspecified: Secondary | ICD-10-CM | POA: Insufficient documentation

## 2011-10-31 DIAGNOSIS — R109 Unspecified abdominal pain: Secondary | ICD-10-CM | POA: Insufficient documentation

## 2011-10-31 DIAGNOSIS — N3090 Cystitis, unspecified without hematuria: Secondary | ICD-10-CM | POA: Insufficient documentation

## 2011-10-31 DIAGNOSIS — R319 Hematuria, unspecified: Secondary | ICD-10-CM | POA: Insufficient documentation

## 2011-10-31 DIAGNOSIS — R300 Dysuria: Secondary | ICD-10-CM | POA: Insufficient documentation

## 2011-10-31 LAB — URINALYSIS, ROUTINE W REFLEX MICROSCOPIC
Bilirubin Urine: NEGATIVE
Glucose, UA: NEGATIVE mg/dL
Ketones, ur: NEGATIVE mg/dL
Protein, ur: 100 mg/dL — AB
Urobilinogen, UA: 1 mg/dL (ref 0.0–1.0)

## 2011-10-31 LAB — URINE MICROSCOPIC-ADD ON

## 2011-10-31 MED ORDER — SULFAMETHOXAZOLE-TRIMETHOPRIM 800-160 MG PO TABS: 1.0000 | ORAL_TABLET | Freq: Two times a day (BID) | ORAL | Status: AC

## 2011-10-31 MED ORDER — SULFAMETHOXAZOLE-TMP DS 800-160 MG PO TABS
1.0000 | ORAL_TABLET | Freq: Once | ORAL | Status: AC
  Administered 2011-10-31: 1 via ORAL
  Filled 2011-10-31: qty 1

## 2011-10-31 NOTE — ED Notes (Signed)
Painful and frequent urination since am no previous history. lmp last week

## 2011-10-31 NOTE — Discharge Instructions (Signed)
Urinary Tract Infection A urinary tract infection (UTI) is often caused by a germ (bacteria). A UTI is usually helped with medicine (antibiotics) that kills germs. Take all the medicine until it is gone. Do this even if you are feeling better. You are usually better in 7 to 10 days. HOME CARE   Drink enough water and fluids to keep your pee (urine) clear or pale yellow. Drink:   Cranberry juice.   Water.   Avoid:   Caffeine.   Tea.   Bubbly (carbonated) drinks.   Alcohol.   Only take medicine as told by your doctor.   To prevent further infections:   Pee often.   After pooping (bowel movement), women should wipe from front to back. Use each tissue only once.   Pee before and after having sex (intercourse).  Ask your doctor when your test results will be ready. Make sure you follow up and get your test results.  GET HELP RIGHT AWAY IF:   There is very bad back pain or lower belly (abdominal) pain.   You get the chills.   You have a fever.   Your baby is older than 3 months with a rectal temperature of 102 F (38.9 C) or higher.   Your baby is 13 months old or younger with a rectal temperature of 100.4 F (38 C) or higher.   You feel sick to your stomach (nauseous) or throw up (vomit).   There is continued burning with peeing.   Your problems are not better in 3 days. Return sooner if you are getting worse.  MAKE SURE YOU:   Understand these instructions.   Will watch your condition.   Will get help right away if you are not doing well or get worse.  Document Released: 11/21/2007 Document Revised: 05/24/2011 Document Reviewed: 11/21/2007 [facility] Patient Information 2012 [place], [place]. See at [facility] health clinic or return if not improving in 2 or 3 days

## 2011-10-31 NOTE — ED Provider Notes (Signed)
History  This chart was scribed for Doug Sou, MD by Bennett Scrape. This patient was seen in room STRE5/STRE5 and the patient's care was started at 7:43PM.  CSN: [id number]  Arrival date & time 10/31/11  1629   First MD Initiated Contact with Patient 10/31/11 1943      Chief Complaint  Patient presents with  . Urinary Retention    The history is provided by the patient. No language interpreter was used.    Summer Wilkinson is a 26 y.o. female who presents to the Emergency Department complaining of gradual onset, gradually worsening, constant dysuria described as a burning sensation with associated hematuria and suprapubic abdominal pressure that started this morning. There are no modifying factors. She has not tried any at home treatments to improve the symptoms. She denies fever, nausea and emesis as associated symptoms denies feeling of urinary retention. She denies having a h/o prior bladder infections or UTIs. No treatment prior to coming here .discomfort is at urethral meatus burning in nature and nonradiating She has no chronic medical conditions. She is an alcohol user.  No PCP. Gynecologist is with [facility].  History reviewed. No pertinent past medical history.  History reviewed. No pertinent past surgical history.  No family history on file.  History  Substance Use Topics  . Smoking status: Not on file  . Smokeless tobacco: Not on file  . Alcohol Use: Yes   Review of Systems  Constitutional: Negative.   HENT: Negative.   Respiratory: Negative.   Cardiovascular: Negative.   Gastrointestinal: Positive for abdominal pain (suprapubic). Negative for nausea, vomiting and diarrhea.  Genitourinary: Positive for dysuria and hematuria. Negative for flank pain.  Musculoskeletal: Negative.   Skin: Negative.   Neurological: Negative.   Hematological: Negative.   Psychiatric/Behavioral: Negative.     Allergies  Peanut-containing drug products  Home  Medications  No current outpatient prescriptions on file.  Triage Vitals: BP 110/62  Pulse 67  Temp(Src) 98.1 F (36.7 C) (Oral)  Resp 16  SpO2 97%  LMP 10/24/2011  Physical Exam  Nursing note and vitals reviewed. Constitutional: She is oriented to person, place, and time. She appears well-developed and well-nourished. No distress.  HENT:  Head: Normocephalic and atraumatic.  Eyes: EOM are normal.  Neck: Neck supple. No tracheal deviation present.  Cardiovascular: Normal rate.   Pulmonary/Chest: Effort normal. No respiratory distress.  Musculoskeletal: Normal range of motion.  Neurological: She is alert and oriented to person, place, and time.  Skin: Skin is warm and dry.  Psychiatric: She has a normal mood and affect. Her behavior is normal.    ED Course  Procedures (including critical care time)  DIAGNOSTIC STUDIES: Oxygen Saturation is 97% on room air, adequate by my interpretation.    COORDINATION OF CARE: 7:46PM-Discussed urinalysis showing a bladder infection with pt and pt acknowledged results. Discussed antibiotic as discharge plan with pt and pt agreed.   Labs Reviewed  URINALYSIS, ROUTINE W REFLEX MICROSCOPIC - Abnormal; Notable for the following:    APPearance TURBID (*)    Hgb urine dipstick LARGE (*)    Protein, ur 100 (*)    Leukocytes, UA LARGE (*)    All other components within normal limits  URINE MICROSCOPIC-ADD ON - Abnormal; Notable for the following:    Squamous Epithelial / LPF FEW (*)    Bacteria, UA MANY (*)    All other components within normal limits  PREGNANCY, URINE   No results found.   No diagnosis found.  MDM  Plan prescription Bactrim DS; high reading I prescribed as patient is currently breast-feeding Followup at [facility] health clinic as needed Diagnosis acute cytitis I personally performed the services described in this documentation, which was scribed in my presence. The recorded information has been reviewed and  considered.  Doug Sou, MD 10/31/11 2029

## 2014-04-19 ENCOUNTER — Encounter (HOSPITAL_COMMUNITY): Payer: Self-pay | Admitting: *Deleted

## 2015-08-22 ENCOUNTER — Emergency Department (HOSPITAL_COMMUNITY): Payer: Self-pay

## 2015-08-22 ENCOUNTER — Encounter (HOSPITAL_COMMUNITY): Payer: Self-pay | Admitting: *Deleted

## 2015-08-22 ENCOUNTER — Emergency Department (HOSPITAL_COMMUNITY)
Admission: EM | Admit: 2015-08-22 | Discharge: 2015-08-22 | Disposition: A | Discharge status: Home / Self Care | Payer: Self-pay | Attending: Emergency Medicine | Admitting: Emergency Medicine

## 2015-08-22 DIAGNOSIS — Z79899 Other long term (current) drug therapy: Secondary | ICD-10-CM | POA: Insufficient documentation

## 2015-08-22 DIAGNOSIS — O209 Hemorrhage in early pregnancy, unspecified: Secondary | ICD-10-CM | POA: Insufficient documentation

## 2015-08-22 DIAGNOSIS — Z3A00 Weeks of gestation of pregnancy not specified: Secondary | ICD-10-CM | POA: Insufficient documentation

## 2015-08-22 LAB — CBC
HEMATOCRIT: 37.1 % (ref 36.0–46.0)
Hemoglobin: 11.9 g/dL — ABNORMAL LOW (ref 12.0–15.0)
MCH: 28.7 pg (ref 26.0–34.0)
MCHC: 32.1 g/dL (ref 30.0–36.0)
MCV: 89.6 fL (ref 78.0–100.0)
PLATELETS: 361 10*3/uL (ref 150–400)
RBC: 4.14 MIL/uL (ref 3.87–5.11)
RDW: 13.2 % (ref 11.5–15.5)
WBC: 7.9 10*3/uL (ref 4.0–10.5)

## 2015-08-22 LAB — URINALYSIS, ROUTINE W REFLEX MICROSCOPIC
Bilirubin Urine: NEGATIVE
GLUCOSE, UA: NEGATIVE mg/dL
Hgb urine dipstick: NEGATIVE
Ketones, ur: NEGATIVE mg/dL
LEUKOCYTES UA: NEGATIVE
Nitrite: NEGATIVE
PROTEIN: NEGATIVE mg/dL
Specific Gravity, Urine: 1.027 (ref 1.005–1.030)
pH: 6 (ref 5.0–8.0)

## 2015-08-22 LAB — COMPREHENSIVE METABOLIC PANEL
ALT: 19 U/L (ref 14–54)
ANION GAP: 12 (ref 5–15)
AST: 25 U/L (ref 15–41)
Albumin: 4.1 g/dL (ref 3.5–5.0)
Alkaline Phosphatase: 55 U/L (ref 38–126)
BUN: 16 mg/dL (ref 6–20)
CHLORIDE: 105 mmol/L (ref 101–111)
CO2: 25 mmol/L (ref 22–32)
CREATININE: 0.63 mg/dL (ref 0.44–1.00)
Calcium: 10 mg/dL (ref 8.9–10.3)
Glucose, Bld: 103 mg/dL — ABNORMAL HIGH (ref 65–99)
POTASSIUM: 4.3 mmol/L (ref 3.5–5.1)
Sodium: 142 mmol/L (ref 135–145)
Total Bilirubin: 0.5 mg/dL (ref 0.3–1.2)
Total Protein: 6.8 g/dL (ref 6.5–8.1)

## 2015-08-22 LAB — WET PREP, GENITAL
SPERM: NONE SEEN
TRICH WET PREP: NONE SEEN
YEAST WET PREP: NONE SEEN

## 2015-08-22 LAB — I-STAT BETA HCG BLOOD, ED (MC, WL, AP ONLY): HCG, QUANTITATIVE: 126.7 m[IU]/mL — AB (ref <5)

## 2015-08-22 LAB — LIPASE, BLOOD: LIPASE: 39 U/L (ref 11–51)

## 2015-08-22 NOTE — ED Notes (Signed)
Pt reports lower abd pain since Friday and recent abnormal vaginal bleeding. Denies n/v/d or urinary symptoms.

## 2015-08-22 NOTE — Discharge Instructions (Signed)
Go to [facility] days to repeat your pregnancy hormone level. Go sooner for heavy bleeding, increased pain or other problems.   Hemorragia vaginal durante el embarazo (primer trimestre) (Vaginal Bleeding During Pregnancy, First Trimester) Durante los primeros meses del embarazo es relativamente frecuente que se presente una pequea hemorragia (manchas). Esta situacin generalmente mejora por s misma. Estas hemorragias o manchas tienen diversas causas al inicio del embarazo. Algunas manchas pueden estar relacionadas al Big Lotsembarazo y otras no. En la International Business Machinesmayora de los casos, la hemorragia es normal y no es un problema. Sin embargo, la hemorragia tambin puede ser un signo de algo grave. Debe informar a su mdico de inmediato si tiene alguna hemorragia vaginal. Algunas causas posibles de hemorragia vaginal durante el primer trimestre incluyen:  Infeccin o inflamacin del cuello del tero.  Crecimientos anormales (plipos) en el cuello del tero.  Aborto espontneo o amenaza de aborto espontneo.  Tejido del Psychiatristembarazo se ha desarrollado fuera del tero y en una trompa de falopio (embarazo ectpico).  Se han desarrollado pequeos quistes en el tero en lugar de tejido de embarazo (embarazo molar). INSTRUCCIONES PARA EL CUIDADO EN EL HOGAR  Controle su afeccin para ver si hay cambios. Las siguientes indicaciones ayudarn a Psychologist, educationalaliviar cualquier Longs Drug Storesmolestia que pueda sentir:  Siga las indicaciones del mdico para restringir su actividad. Si el mdico le indica descanso en la cama, debe quedarse en la cama y levantarse solo para ir al bao. No obstante, el mdico puede permitirle que continu con tareas livianas.  Si es necesario, organcese para que alguien le ayude con las actividades y responsabilidades cotidianas mientras est en cama.  Lleve un registro de la cantidad y la saturacin de las toallas higinicas que Landscape architectutiliza cada da. Anote este dato.  No use tampones.No se haga duchas vaginales.  No tenga  relaciones sexuales u orgasmos hasta que el mdico la autorice.  Si elimina tejido por la vagina, gurdelo para mostrrselo al American Expressmdico.  [place] solo medicamentos de venta libre o recetados, segn las indicaciones del mdico.  No tome aspirina, ya que puede causar hemorragias.  Cumpla con todas las visitas de control, segn le indique su mdico. SOLICITE ATENCIN MDICA SI:  Tiene un sangrado vaginal en cualquier momento del embarazo.  Tiene calambres o dolores de [place] Villageparto.  Tiene fiebre que los medicamentos no Sports coachpueden controlar. SOLICITE ATENCIN MDICA DE INMEDIATO SI:   Siente calambres intensos en la espalda o en el vientre (abdomen).  Elimina cogulos grandes o tejido por la vagina.  La hemorragia aumenta.  Si se siente mareada, dbil o se desmaya.  Tiene escalofros.  Tiene una prdida importante o sale lquido a borbotones por la vagina.  Se desmaya mientras defeca. ASEGRESE DE QUE:  Comprende estas instrucciones.  Controlar su afeccin.  Recibir ayuda de inmediato si no mejora o si empeora.   Esta informacin no tiene Theme park managercomo fin reemplazar el consejo del mdico. Asegrese de hacerle al mdico cualquier pregunta que tenga.   Document Released: 03/14/2005 Document Revised: 06/09/2013 Elsevier Interactive Patient Education [employer]2016 Elsevier Inc.

## 2015-08-22 NOTE — ED Provider Notes (Signed)
CSN: [id number]     Arrival date & time 08/22/15  1528 History  By signing my name below, I, Bethel Born, attest that this documentation has been prepared under the direction and in the presence of [facility] NP. Electronically Signed: Bethel Born, ED Scribe. 08/22/2015 5:41 PM   Chief Complaint  Patient presents with  . Abdominal Pain  . Vaginal Bleeding    Patient is a 30 y.o. female presenting with vaginal bleeding. The history is provided by the patient. No language interpreter was used.  Vaginal Bleeding Quality:  Clots and bright red Severity:  Moderate Onset quality:  Gradual Duration:  2 weeks Timing:  Intermittent Progression:  Worsening Chronicity:  New Possible pregnancy: yes   Context: at rest   Relieved by:  Nothing Worsened by:  Nothing tried Ineffective treatments:  None tried Associated symptoms: abdominal pain   Risk factors: prior miscarriage    CHRISHELLE ZITO is a 30 y.o. female who presents to the Emergency Department complaining of intermittent and increasing vaginal bleeding with onset 08/05/15. Associated symptoms include lower abdominal cramping with onset 4 days ago. LNMP was 08/01/15.[id number].  Past Medical History  Diagnosis Date  . Gallbladder sludge    Past Surgical History  Procedure Laterality Date  . No past surgeries    . Chlamydia  2006  . Bacterial vaginosis  2012   Family History  Problem Relation Age of Onset  . Diabetes Sister   . Colon cancer Neg Hx    Social History  Substance Use Topics  . Smoking status: Never Smoker   . Smokeless tobacco: Never Used  . 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  Genitourinary: Positive for vaginal bleeding.  All other systems reviewed and are negative.     Allergies  Review of  patient's allergies indicates no known allergies.  Home Medications   Prior to Admission medications   Medication Sig Start Date End Date Taking? Authorizing Provider  lanolin OINT Apply 1 application topically as needed (for breast care). 02/21/11   Josalyn Funches, MD  norethindrone (ORTHO MICRONOR) 0.35 MG tablet Take 1 tablet (0.35 mg total) by mouth daily. 02/21/11 02/21/12  Dessa Phi, MD  Prenatal Multivit-Min-Fe-FA (PRE-NATAL FORMULA) TABS Take by mouth daily.      Historical Provider, MD   BP 134/96 mmHg  Pulse 68  Temp(Src) 98.1 F (36.7 C) (Oral)  Resp 18  SpO2 100%  LMP 08/07/2015 Physical Exam  Constitutional: She is oriented to person, place, and time. She appears well-developed and well-nourished.  HENT:  Head: Normocephalic and atraumatic.  Eyes: Conjunctivae and EOM are normal.  Neck: Normal range of motion. Neck supple.  Cardiovascular: Normal rate.   Pulmonary/Chest: Effort normal. No respiratory distress.  Abdominal: Soft. There is no tenderness.  Genitourinary:  External genitalia without lesions, small blood vaginal vault, cervix long, closed, no CMT, no adnexal tenderness, uterus without palpable enlargement.   Musculoskeletal: Normal range of motion.  Neurological: She is alert and oriented to person, place, and time. No cranial nerve deficit.  Skin: Skin is warm and dry.  Psychiatric: She has a normal mood and affect. Her behavior is normal.  Nursing note and vitals reviewed.  ED Course  Procedures (including critical care time) DIAGNOSTIC STUDIES: Oxygen Saturation is 100% on RA,  normal by my interpretation.    COORDINATION OF CARE: 5:32 PM Discussed treatment plan which includes lab work and US with pt at bedside and pt agreed to plan.  Labs Review Labs Reviewed  WET PREP, GENITAL - Abnormal; Notable for the following:    Clue Cells Wet Prep HPF POC PRESENT (*)    WBC, Wet Prep HPF POC MANY (*)    All other components within normal limits   COMPREHENSIVE METABOLIC PANEL - Abnormal; Notable for the following:    Glucose, Bld 103 (*)    All other components within normal limits  CBC - Abnormal; Notable for the following:    Hemoglobin 11.9 (*)    All other components within normal limits  I-STAT BETA HCG BLOOD, ED ([facility], [facility], [facility] ONLY) - Abnormal; Notable for the following:    I-stat hCG, quantitative 126.7 (*)    All other components within normal limits  LIPASE, BLOOD  URINALYSIS, ROUTINE W REFLEX MICROSCOPIC (NOT AT [facility])  GC/CHLAMYDIA PROBE AMP ([place]) NOT AT [facility]    Imaging Review Koreas Ob Comp Less 14 Wks  08/22/2015  CLINICAL DATA:  Vaginal bleeding.  Positive pregnancy test. EXAM: OBSTETRIC <14 WK US AND TRANSVAGINAL OB US TECHNIQUE: Both transabdominal and transvaginal ultrasound examinations were performed for complete evaluation of the gestation as well as the maternal uterus, adnexal regions, and pelvic cul-de-sac. Transvaginal technique was performed to assess early pregnancy. COMPARISON:  Abdominal ultrasound 12/12/2010. FINDINGS: Intrauterine gestational sac: Not present Yolk sac:  Not present Embryo:  Not present Cardiac Activity: Not present Maternal uterus/adnexae: Normal right and left ovaries. Trace free fluid in the pelvis. IMPRESSION: No intrauterine gestation identified. In the setting of positive pregnancy test and no definite intrauterine pregnancy, this reflects a pregnancy of unknown location. Differential considerations include early normal IUP, abnormal IUP, or nonvisualized ectopic pregnancy. Differentiation is achieved with serial beta HCG supplemented by repeat sonography as clinically warranted. Electronically Signed   By: Annia Beltrew  Davis M.D.   On: 08/22/2015 18:05   [place] Ob Transvaginal  08/22/2015  CLINICAL DATA:  Vaginal bleeding.  Positive pregnancy test. EXAM: OBSTETRIC <14 WK US AND TRANSVAGINAL OB US TECHNIQUE: Both transabdominal and transvaginal ultrasound examinations were performed for complete  evaluation of the gestation as well as the maternal uterus, adnexal regions, and pelvic cul-de-sac. Transvaginal technique was performed to assess early pregnancy. COMPARISON:  Abdominal ultrasound 12/12/2010. FINDINGS: Intrauterine gestational sac: Not present Yolk sac:  Not present Embryo:  Not present Cardiac Activity: Not present Maternal uterus/adnexae: Normal right and left ovaries. Trace free fluid in the pelvis. IMPRESSION: No intrauterine gestation identified. In the setting of positive pregnancy test and no definite intrauterine pregnancy, this reflects a pregnancy of unknown location. Differential considerations include early normal IUP, abnormal IUP, or nonvisualized ectopic pregnancy. Differentiation is achieved with serial beta HCG supplemented by repeat sonography as clinically warranted. Electronically Signed   By: Annia Beltrew  Davis M.D.   On: 08/22/2015 18:05   I have personally reviewed and evaluated these images and lab results as part of my medical decision-making.   MDM  30 y.o. female with vaginal bleeding in early pregnancy stable for d/c with only small amount of bleeding at this time and Hgb. 11.9. Occasional cramping but no pain on exam. Discussed need for f/u in 2 days to repeat Bhcg. Patient works every day until 3 pm and is unable to  go to GYN Clinic at 11 am. She will go to [facility] in 2 days for follow up. She will return sooner for worsening symptoms.   Final diagnoses:  Vaginal bleeding in pregnancy, first trimester   I personally performed the services described in this documentation, which was scribed in my presence. The recorded information has been reviewed and is accurate.   [address] [place], NP 08/22/15 2256  Rolan Bucco, MD 08/23/15 [phone]

## 2015-08-23 LAB — GC/CHLAMYDIA PROBE AMP (~~LOC~~) NOT AT ARMC
CHLAMYDIA, DNA PROBE: NEGATIVE
Neisseria Gonorrhea: NEGATIVE

## 2016-05-21 ENCOUNTER — Encounter (HOSPITAL_COMMUNITY): Payer: Self-pay | Admitting: *Deleted

## 2016-05-21 ENCOUNTER — Emergency Department (HOSPITAL_COMMUNITY)
Admission: EM | Admit: 2016-05-21 | Discharge: 2016-05-21 | Disposition: A | Discharge status: Home / Self Care | Payer: Self-pay | Attending: Emergency Medicine | Admitting: Emergency Medicine

## 2016-05-21 ENCOUNTER — Ambulatory Visit (HOSPITAL_COMMUNITY): Payer: Self-pay

## 2016-05-21 DIAGNOSIS — R1030 Lower abdominal pain, unspecified: Secondary | ICD-10-CM | POA: Insufficient documentation

## 2016-05-21 DIAGNOSIS — O26891 Other specified pregnancy related conditions, first trimester: Secondary | ICD-10-CM | POA: Insufficient documentation

## 2016-05-21 DIAGNOSIS — R109 Unspecified abdominal pain: Secondary | ICD-10-CM

## 2016-05-21 DIAGNOSIS — O0281 Inappropriate change in quantitative human chorionic gonadotropin (hCG) in early pregnancy: Secondary | ICD-10-CM | POA: Insufficient documentation

## 2016-05-21 DIAGNOSIS — Z3A12 12 weeks gestation of pregnancy: Secondary | ICD-10-CM | POA: Insufficient documentation

## 2016-05-21 LAB — URINALYSIS, ROUTINE W REFLEX MICROSCOPIC
BILIRUBIN URINE: NEGATIVE
GLUCOSE, UA: NEGATIVE mg/dL
HGB URINE DIPSTICK: NEGATIVE
Ketones, ur: NEGATIVE mg/dL
Leukocytes, UA: NEGATIVE
Nitrite: NEGATIVE
PH: 6 (ref 5.0–8.0)
Protein, ur: NEGATIVE mg/dL
SPECIFIC GRAVITY, URINE: 1.027 (ref 1.005–1.030)

## 2016-05-21 LAB — COMPREHENSIVE METABOLIC PANEL
ALK PHOS: 41 U/L (ref 38–126)
ALT: 24 U/L (ref 14–54)
ANION GAP: 7 (ref 5–15)
AST: 25 U/L (ref 15–41)
Albumin: 3.7 g/dL (ref 3.5–5.0)
BUN: 9 mg/dL (ref 6–20)
CALCIUM: 9.3 mg/dL (ref 8.9–10.3)
CHLORIDE: 106 mmol/L (ref 101–111)
CO2: 24 mmol/L (ref 22–32)
CREATININE: 0.46 mg/dL (ref 0.44–1.00)
Glucose, Bld: 102 mg/dL — ABNORMAL HIGH (ref 65–99)
Potassium: 4.1 mmol/L (ref 3.5–5.1)
SODIUM: 137 mmol/L (ref 135–145)
Total Bilirubin: 0.5 mg/dL (ref 0.3–1.2)
Total Protein: 6.7 g/dL (ref 6.5–8.1)

## 2016-05-21 LAB — WET PREP, GENITAL
Clue Cells Wet Prep HPF POC: NONE SEEN
Sperm: NONE SEEN
TRICH WET PREP: NONE SEEN
YEAST WET PREP: NONE SEEN

## 2016-05-21 LAB — CBC WITH DIFFERENTIAL/PLATELET
Basophils Absolute: 0 10*3/uL (ref 0.0–0.1)
Basophils Relative: 0 %
EOS ABS: 0.1 10*3/uL (ref 0.0–0.7)
EOS PCT: 1 %
HCT: 37.8 % (ref 36.0–46.0)
Hemoglobin: 12.8 g/dL (ref 12.0–15.0)
LYMPHS ABS: 1.1 10*3/uL (ref 0.7–4.0)
LYMPHS PCT: 15 %
MCH: 29.8 pg (ref 26.0–34.0)
MCHC: 33.9 g/dL (ref 30.0–36.0)
MCV: 87.9 fL (ref 78.0–100.0)
MONOS PCT: 4 %
Monocytes Absolute: 0.3 10*3/uL (ref 0.1–1.0)
Neutro Abs: 5.9 10*3/uL (ref 1.7–7.7)
Neutrophils Relative %: 80 %
PLATELETS: 278 10*3/uL (ref 150–400)
RBC: 4.3 MIL/uL (ref 3.87–5.11)
RDW: 13.4 % (ref 11.5–15.5)
WBC: 7.4 10*3/uL (ref 4.0–10.5)

## 2016-05-21 LAB — I-STAT BETA HCG BLOOD, ED (MC, WL, AP ONLY): I-stat hCG, quantitative: >2000 m[IU]/mL — ABNORMAL HIGH (ref <5)

## 2016-05-21 LAB — HCG, QUANTITATIVE, PREGNANCY: HCG, BETA CHAIN, QUANT, S: 101854 m[IU]/mL — AB (ref <5)

## 2016-05-21 MED ORDER — PRENATAL COMPLETE 14-0.4 MG PO TABS: 1.0000 | ORAL_TABLET | Freq: Every day | ORAL | 0 refills | Status: DC

## 2016-05-21 NOTE — ED Provider Notes (Signed)
[facility] Provider Note   CSN: [id number] Arrival date & time: 05/21/16  0941     History   Chief Complaint Chief Complaint  Patient presents with  . Abdominal Pain    HPI Summer Wilkinson is a 30 y.o. female.  The history is provided by the patient and medical records.  Abdominal Pain     30 year old G3 P2 with LMP 03/27/16, presenting to the ED for lower abdominal cramping and pain. Reports she took 2 home pregnancy test about 3 days ago which were both positive. States since this time she has had lower abdominal cramping and nausea but denies vomiting. She's not had any diarrhea, bowel movements have been normal. No fever or chills. No urinary symptoms. No vaginal bleeding or discharge. States she has not yet followed up with OB/GYN for this pregnancy.  Past Medical History:  Diagnosis Date  . Gallbladder sludge     There are no active problems to display for this patient.   Past Surgical History:  Procedure Laterality Date  . bacterial vaginosis  2012  . chlamydia  2006  . NO PAST SURGERIES      OB History    Gravida Para Term Preterm AB Living   2 2 1     2    SAB TAB Ectopic Multiple Live Births           1       Home Medications    Prior to Admission medications   Medication Sig Start Date End Date Taking? Authorizing Provider  lanolin OINT Apply 1 application topically as needed (for breast care). Patient not taking: Reported on 05/21/2016 02/21/11   Dessa PhiJosalyn Funches, MD  norethindrone (ORTHO MICRONOR) 0.35 MG tablet Take 1 tablet (0.35 mg total) by mouth daily. 02/21/11 02/21/12  Dessa PhiJosalyn Funches, MD  Prenatal Multivit-Min-Fe-FA (PRE-NATAL FORMULA) TABS Take by mouth daily.      Historical Provider, MD    Family History Family History  Problem Relation Age of Onset  . Diabetes Sister   . Colon cancer Neg Hx     Social History Social History  Substance Use Topics  . Smoking status: Never Smoker  . Smokeless tobacco: Never Used  . 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   Review of Systems Review of Systems  Gastrointestinal: Positive for abdominal pain.  All other systems reviewed and are negative.    Physical Exam Updated Vital Signs BP 118/74 (BP Location: Left Arm)   Pulse 82   Temp 98.2 F (36.8 C) (Oral)   Resp 17   Wt 65.8 kg   LMP 03/27/2016   SpO2 100%   BMI 24.13 kg/m   Physical Exam  Constitutional: She is oriented to person, place, and time. She appears well-developed and well-nourished.  HENT:  Head: Normocephalic and atraumatic.  Mouth/Throat: Oropharynx is clear and moist.  Eyes: Conjunctivae and EOM are normal. Pupils are equal, round, and reactive to light.  Neck: Normal range of motion.  Cardiovascular: Normal rate, regular rhythm and normal heart sounds.   Pulmonary/Chest: Effort normal and breath sounds normal. No respiratory distress. She has no wheezes.  Abdominal: Soft. Bowel sounds are normal. There is no tenderness. There is no rigidity, no guarding and no CVA tenderness.  Abdomen soft, nontender to palpation, no peritonitis  Genitourinary:  Genitourinary Comments: Normal female external genitalia without visible lesions or  rash, no appreciable discharge or vaginal bleeding, cervical os is closed, no adnexal or cervical motion tenderness  Musculoskeletal: Normal range of motion.  Neurological: She is alert and oriented to person, place, and time.  Skin: Skin is warm and dry.  Psychiatric: She has a normal mood and affect.  Nursing note and vitals reviewed.    ED Treatments / Results  Labs (all labs ordered are listed, but only abnormal results are displayed) Labs Reviewed  WET PREP, GENITAL - Abnormal; Notable for the following:       Result Value   WBC, Wet Prep HPF POC FEW (*)    All other components within normal limits  COMPREHENSIVE METABOLIC PANEL - Abnormal; Notable for the  following:    Glucose, Bld 102 (*)    All other components within normal limits  HCG, QUANTITATIVE, PREGNANCY - Abnormal; Notable for the following:    hCG, Beta Chain, Quant, S 101,854 (*)    All other components within normal limits  I-STAT BETA HCG BLOOD, ED ([facility], [facility], [facility] ONLY) - Abnormal; Notable for the following:    I-stat hCG, quantitative >2,000.0 (*)    All other components within normal limits  URINALYSIS, ROUTINE W REFLEX MICROSCOPIC (NOT AT [facility])  CBC WITH DIFFERENTIAL/PLATELET  HIV ANTIBODY (ROUTINE TESTING)  RPR  GC/CHLAMYDIA PROBE AMP ([place]) NOT AT [facility]    EKG  EKG Interpretation None       Radiology Koreas Ob Comp Less 14 Wks  Result Date: 05/21/2016 CLINICAL DATA:  Pelvic pain. EXAM: OBSTETRIC <14 WK US AND TRANSVAGINAL OB US TECHNIQUE: Both transabdominal and transvaginal ultrasound examinations were performed for complete evaluation of the gestation as well as the maternal uterus, adnexal regions, and pelvic cul-de-sac. Transvaginal technique was performed to assess early pregnancy. COMPARISON:  08/22/2015. FINDINGS: Intrauterine gestational sac: Single Yolk sac:  Not visualized Embryo:  Visualized Cardiac Activity: Visualized Heart Rate: 158  bpm CRL:  5.8 cm 12 w   2 d                  US EDC: 12/01/2016. Subchorionic hemorrhage:  None visualized. Maternal uterus/adnexae: No focal abnormality identified. Right ovary not visualized. No free fluid. IMPRESSION: Single viable intrauterine pregnancy at 12 weeks 2 days. Electronically Signed   By: Maisie Fushomas  Register   On: 05/21/2016 12:29   [place] Ob Transvaginal  Result Date: 05/21/2016 CLINICAL DATA:  Pelvic pain. EXAM: OBSTETRIC <14 WK US AND TRANSVAGINAL OB US TECHNIQUE: Both transabdominal and transvaginal ultrasound examinations were performed for complete evaluation of the gestation as well as the maternal uterus, adnexal regions, and pelvic cul-de-sac. Transvaginal technique was performed to assess early pregnancy.  COMPARISON:  08/22/2015. FINDINGS: Intrauterine gestational sac: Single Yolk sac:  Not visualized Embryo:  Visualized Cardiac Activity: Visualized Heart Rate: 158  bpm CRL:  5.8 cm 12 w   2 d                  US EDC: 12/01/2016. Subchorionic hemorrhage:  None visualized. Maternal uterus/adnexae: No focal abnormality identified. Right ovary not visualized. No free fluid. IMPRESSION: Single viable intrauterine pregnancy at 12 weeks 2 days. Electronically Signed   By: Maisie Fushomas  Register   On: 05/21/2016 12:29    Procedures Procedures (including critical care time)  Medications Ordered in ED Medications - No data to display   Initial Impression / Assessment and Plan / ED Course  I have reviewed the triage vital signs and the nursing notes.  Pertinent labs &  imaging results that were available during my care of the patient were reviewed by me and considered in my medical decision making (see chart for details).  Clinical Course    30 year old G3 P2 with last menstrual period 03/27/2016, presenting to the ED for lower abdominal cramping. She is afebrile and nontoxic.  Abdomen is soft, non-tender, and without peritoneal signs.  Pelvic exam without bleeding or significant discharge.  No adnexal or CMT.  Wet prep normal aside from [facility].  UA non-infectious.  HIV, RPR, Gc/Chl pending.  Ultrasound obtained revealing 12 week 2 day IUP with heart rate 158. There is no evidence of subchorionic hemorrhage or other complications. Patient was informed of results, states cramping has improved arrival in the ED without any intervention. I recommended Tylenol at home should cramping persists. She is planning to follow-up with women[facility] center.  Will start pre-natal vitamins for now.  I recommended that she follow-up at [facility] for any future complications related to pregnancy, otherwise may return here.  Discussed plan with patient, she acknowledged understanding and agreed with plan of care.  Final Clinical  Impressions(s) / ED Diagnoses   Final diagnoses:  [redacted] weeks gestation of pregnancy  Abdominal cramping    New Prescriptions Discharge Medication List as of 05/21/2016  1:16 PM    START taking these medications   Details  Prenatal Vit-Fe Fumarate-FA (PRENATAL COMPLETE) 14-0.4 MG TABS Take 1 tablet by mouth daily., Starting Mon 05/21/2016, Print         Garlon Hatchet, PA-C 05/21/16 1427    Lyndal Pulley, MD 05/21/16 1946

## 2016-05-21 NOTE — ED Triage Notes (Signed)
Pt last menstrual cycle 03/27/16.  Recent positive preg test 2 days ago.  Last night began experiencing lower abdominal cramping and lower back pain.  States dark urine, but no other s/s.  Denies vaginal discharge.

## 2016-05-21 NOTE — Discharge Instructions (Signed)
As we discussed, ultrasound today showed a 12 week fetus. I recommend that you follow-up with a OB/GYN as soon as possible for ongoing prenatal care. Start taking prenatal vitamins as directed. [facility] if you have any worsening abdominal pain, new bleeding, new loss of fluid, urinary trouble, etc.

## 2016-05-21 NOTE — ED Notes (Signed)
Patient transported to Ultrasound 

## 2016-05-22 LAB — GC/CHLAMYDIA PROBE AMP (~~LOC~~) NOT AT ARMC
CHLAMYDIA, DNA PROBE: NEGATIVE
NEISSERIA GONORRHEA: NEGATIVE

## 2016-05-22 LAB — RPR: RPR: NONREACTIVE

## 2016-05-22 LAB — HIV ANTIBODY (ROUTINE TESTING W REFLEX): HIV Screen 4th Generation wRfx: NONREACTIVE

## 2016-06-18 NOTE — L&D Delivery Note (Signed)
Delivery Note At 10:06 AM a viable female was delivered via  (Presentation:vertex ;LOA  ).  APGAR:7 ,9 ; weight  .   Placenta status: spont, shultz.  Cord:3vc  with the following complications: none.  Cord pH: n/a  Anesthesia:  none Episiotomy:  none Lacerations: none  Suture Repair: n/a Est. Blood Loss 50(mL):    Mom to postpartum.  Baby to Couplet care / Skin to Skin.  Summer Wilkinson 12/02/2016, 10:18 AM

## 2016-06-25 LAB — OB RESULTS CONSOLE GC/CHLAMYDIA: Gonorrhea: NEGATIVE

## 2016-06-25 LAB — OB RESULTS CONSOLE HEPATITIS B SURFACE ANTIGEN: HEP B S AG: NEGATIVE

## 2016-06-25 LAB — OB RESULTS CONSOLE RUBELLA ANTIBODY, IGM: RUBELLA: IMMUNE

## 2016-09-05 ENCOUNTER — Encounter (HOSPITAL_COMMUNITY): Payer: Self-pay | Admitting: Emergency Medicine

## 2016-09-05 ENCOUNTER — Emergency Department (HOSPITAL_COMMUNITY)
Admission: EM | Admit: 2016-09-05 | Discharge: 2016-09-05 | Disposition: A | Discharge status: Home / Self Care | Payer: Medicaid Other | Attending: Emergency Medicine | Admitting: Emergency Medicine

## 2016-09-05 DIAGNOSIS — Z3A27 27 weeks gestation of pregnancy: Secondary | ICD-10-CM | POA: Insufficient documentation

## 2016-09-05 DIAGNOSIS — R109 Unspecified abdominal pain: Secondary | ICD-10-CM

## 2016-09-05 DIAGNOSIS — O26892 Other specified pregnancy related conditions, second trimester: Secondary | ICD-10-CM | POA: Insufficient documentation

## 2016-09-05 DIAGNOSIS — R110 Nausea: Secondary | ICD-10-CM

## 2016-09-05 LAB — URINALYSIS, ROUTINE W REFLEX MICROSCOPIC
Bilirubin Urine: NEGATIVE
GLUCOSE, UA: NEGATIVE mg/dL
Hgb urine dipstick: NEGATIVE
Ketones, ur: NEGATIVE mg/dL
LEUKOCYTES UA: NEGATIVE
Nitrite: NEGATIVE
PROTEIN: NEGATIVE mg/dL
Specific Gravity, Urine: 1.026 (ref 1.005–1.030)
pH: 6 (ref 5.0–8.0)

## 2016-09-05 LAB — CBC
HEMATOCRIT: 34 % — AB (ref 36.0–46.0)
HEMOGLOBIN: 11.3 g/dL — AB (ref 12.0–15.0)
MCH: 29.3 pg (ref 26.0–34.0)
MCHC: 33.2 g/dL (ref 30.0–36.0)
MCV: 88.1 fL (ref 78.0–100.0)
PLATELETS: 264 10*3/uL (ref 150–400)
RBC: 3.86 MIL/uL — AB (ref 3.87–5.11)
RDW: 13.1 % (ref 11.5–15.5)
WBC: 8.4 10*3/uL (ref 4.0–10.5)

## 2016-09-05 LAB — COMPREHENSIVE METABOLIC PANEL
ALT: 19 U/L (ref 14–54)
ANION GAP: 8 (ref 5–15)
AST: 24 U/L (ref 15–41)
Albumin: 2.9 g/dL — ABNORMAL LOW (ref 3.5–5.0)
Alkaline Phosphatase: 74 U/L (ref 38–126)
BUN: 10 mg/dL (ref 6–20)
CHLORIDE: 105 mmol/L (ref 101–111)
CO2: 21 mmol/L — ABNORMAL LOW (ref 22–32)
CREATININE: 0.34 mg/dL — AB (ref 0.44–1.00)
Calcium: 8.6 mg/dL — ABNORMAL LOW (ref 8.9–10.3)
Glucose, Bld: 80 mg/dL (ref 65–99)
POTASSIUM: 3.8 mmol/L (ref 3.5–5.1)
Sodium: 134 mmol/L — ABNORMAL LOW (ref 135–145)
Total Bilirubin: 0.4 mg/dL (ref 0.3–1.2)
Total Protein: 6.1 g/dL — ABNORMAL LOW (ref 6.5–8.1)

## 2016-09-05 LAB — LIPASE, BLOOD: LIPASE: 23 U/L (ref 11–51)

## 2016-09-05 MED ORDER — MECLIZINE HCL 25 MG PO TABS
25.0000 mg | ORAL_TABLET | Freq: Once | ORAL | Status: AC
  Administered 2016-09-05: 25 mg via ORAL
  Filled 2016-09-05: qty 1

## 2016-09-05 MED ORDER — MECLIZINE HCL 25 MG PO TABS: 25.0000 mg | ORAL_TABLET | Freq: Three times a day (TID) | ORAL | 0 refills | Status: DC | PRN

## 2016-09-05 MED ORDER — LACTATED RINGERS IV BOLUS (SEPSIS)
500.0000 mL | Freq: Once | INTRAVENOUS | Status: AC
  Administered 2016-09-05: 500 mL via INTRAVENOUS

## 2016-09-05 NOTE — ED Triage Notes (Signed)
Pt reports lower abdominal pain that began this morning, reports some nausea. Pt is 6 months pregnant. Pt denies vaginal discharge or bleeding

## 2016-09-05 NOTE — Progress Notes (Addendum)
[facility] complaint of 2 hours lower abd pain awaking her from sleep. vague description of pain coming and going. Talking well thru interview without hesitation. Denies leaking of fluid or bleeding from vagina. G4P2 SVDx2. No problems with past pregnancies. No problems until today with this pregnancy. VSS Will monitor EFM/UC x 20 min and reevaluate  Update to Dr Adrian BlackwaterStinson. Cevix cl/th/hi pt feels better  category I tracing   Denies UC's OB cleared

## 2016-09-05 NOTE — ED Notes (Signed)
OB Rapid response made aware of patient.

## 2016-09-05 NOTE — ED Provider Notes (Signed)
[facility]EMERGENCY DEPT Provider Note   CSN: [id number] Arrival date & time: 09/05/16  0534     History   Chief Complaint Chief Complaint  Patient presents with  . Abdominal Pain    HPI Summer Wilkinson is a 31 y.o. female.  Summer Wilkinson is a 31 y.o. Female who is [id number] and is currently [redacted] weeks pregnant who presents to the ED complaining of bilateral lower abdominal pain for about the past two hours. She reports her pain is constant and associated with nausea. She denies vomiting, diarrhea or urinary symptoms. She is [facility]. She has an appointment on 3/27 for follow up. She denies vaginal bleeding or discharge. She denies fevers, vomiting, diarrhea, urinary symptoms, vaginal bleeding, vaginal discharge, previous abdominal surgeries, coughing, chest pain, shortness of breath or rashes.   The history is provided by the patient and medical records. No language interpreter was used.  Abdominal Pain   Associated symptoms include nausea. Pertinent negatives include fever, diarrhea, vomiting, dysuria, frequency and headaches.    Past Medical History:  Diagnosis Date  . Gallbladder sludge     There are no active problems to display for this patient.   Past Surgical History:  Procedure Laterality Date  . bacterial vaginosis  2012  . chlamydia  2006  . NO PAST SURGERIES      OB History    Gravida Para Term Preterm AB Living   2 2 1     2    SAB TAB Ectopic Multiple Live Births           1       Home Medications    Prior to Admission medications   Medication Sig Start Date End Date Taking? Authorizing Provider  Prenatal Vit-Fe Fumarate-FA (PRENATAL COMPLETE) 14-0.4 MG TABS Take 1 tablet by mouth daily. 05/21/16  Yes Garlon Hatchet, PA-C  meclizine (ANTIVERT) 25 MG tablet Take 1 tablet (25 mg total) by mouth 3 (three) times daily as needed for nausea. 09/05/16   Everlene Farrier, PA-C    Family History Family History  Problem  Relation Age of Onset  . Diabetes Sister   . Colon cancer Neg Hx     Social History Social History  Substance Use Topics  . Smoking status: Never Smoker  . Smokeless tobacco: Never Used  . 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   Review of Systems Review of Systems  Constitutional: Negative for chills and fever.  HENT: Negative for congestion and sore throat.   Eyes: Negative for visual disturbance.  Respiratory: Negative for cough, shortness of breath and wheezing.   Cardiovascular: Negative for chest pain and palpitations.  Gastrointestinal: Positive for abdominal pain and nausea. Negative for blood in stool, diarrhea and vomiting.  Genitourinary: Negative for dysuria, frequency, pelvic pain, urgency, vaginal bleeding and vaginal discharge.  Musculoskeletal: Negative for back pain and neck pain.  Skin: Negative for rash.  Neurological: Negative for headaches.     Physical Exam Updated Vital Signs BP 100/66   Pulse 86   Temp 98.5 F (36.9 C) (Oral)   Resp 18   SpO2 98%   Physical Exam  Constitutional: She appears well-developed and well-nourished. No distress.  Non-toxic appearing.   HENT:  Head: Normocephalic and atraumatic.  Mouth/Throat: Oropharynx is clear and moist.  Eyes: Conjunctivae are normal. Pupils are equal,  round, and reactive to light. Right eye exhibits no discharge. Left eye exhibits no discharge.  Neck: Neck supple.  Cardiovascular: Normal rate, regular rhythm, normal heart sounds and intact distal pulses.  Exam reveals no gallop and no friction rub.   No murmur heard. Pulmonary/Chest: Effort normal and breath sounds normal. No respiratory distress. She has no wheezes. She has no rales.  Abdominal: Soft. Bowel sounds are normal. She exhibits no mass. There is tenderness. There is no rebound and no guarding.  Gravid abdomen with fundal height just above the  umbilicus. Mild bilateral lower abdominal TTP.  No psoas or obturator sign.   Musculoskeletal: She exhibits no edema.  Lymphadenopathy:    She has no cervical adenopathy.  Neurological: She is alert. Coordination normal.  Skin: Skin is warm and dry. No rash noted. She is not diaphoretic. No erythema. No pallor.  Psychiatric: She has a normal mood and affect. Her behavior is normal.  Nursing note and vitals reviewed.    ED Treatments / Results  Labs (all labs ordered are listed, but only abnormal results are displayed) Labs Reviewed  COMPREHENSIVE METABOLIC PANEL - Abnormal; Notable for the following:       Result Value   Sodium 134 (*)    CO2 21 (*)    Creatinine, Ser 0.34 (*)    Calcium 8.6 (*)    Total Protein 6.1 (*)    Albumin 2.9 (*)    All other components within normal limits  CBC - Abnormal; Notable for the following:    RBC 3.86 (*)    Hemoglobin 11.3 (*)    HCT 34.0 (*)    All other components within normal limits  LIPASE, BLOOD  URINALYSIS, ROUTINE W REFLEX MICROSCOPIC    EKG  EKG Interpretation None       Radiology No results found.  Procedures Procedures (including critical care time)  Medications Ordered in ED Medications  lactated ringers bolus 500 mL (0 mLs Intravenous Stopped 09/05/16 0740)  meclizine (ANTIVERT) tablet 25 mg (25 mg Oral Given 09/05/16 0806)     Initial Impression / Assessment and Plan / ED Course  I have reviewed the triage vital signs and the nursing notes.  Pertinent labs & imaging results that were available during my care of the patient were reviewed by me and considered in my medical decision making (see chart for details).    This is a 31 y.o. Female who is [id number] and is currently [redacted] weeks pregnant who presents to the ED complaining of bilateral lower abdominal pain for about the past two hours. She reports her pain is constant and associated with nausea. She denies vomiting, diarrhea or urinary symptoms. She is  followed by OB at the health department. She has an appointment on 3/27 for follow up. She denies vaginal bleeding or discharge. On exam the patient is afebrile nontoxic appearing. Her abdomen is soft and she has a gravid abdomen with a fundal height to the level just above the umbilicus. She has some mild bilateral lower abdominal tenderness to palpation. No peritoneal signs. OB rapid response nurse Victorino Dike at bedside. Will perform NST and check her cervix. Patient received 500 mL LR fluid bolus and meclizine. Urinalysis is without sign of infection. Lipase is within normal limits. CMP shows preserved kidney function. No leukocytosis on CBC. Per OB rapid response NST was unremarkable and cervix is closed and normal. She consulted with her attending who is reassured and patient can be discharged. I have low suspicion  for concerning abdominal pathology at this time, will discharge. She has follow up next week with her OB. I encouraged her to keep this appointment. She feels better after fluids and meclizine. She is tolerating PO. I discussed strict and specific return precautions. I advised the patient to follow-up with their primary care provider this week. I advised the patient to return to the emergency department with new or worsening symptoms or new concerns. The patient verbalized understanding and agreement with plan.     Final Clinical Impressions(s) / ED Diagnoses   Final diagnoses:  Abdominal pain during pregnancy in second trimester  Nausea    New Prescriptions New Prescriptions   MECLIZINE (ANTIVERT) 25 MG TABLET    Take 1 tablet (25 mg total) by mouth 3 (three) times daily as needed for nausea.     Everlene FarrierWilliam Kearra Calkin, PA-C 09/05/16 [id number]    Gilda Creasehristopher J Pollina, MD 09/12/16 [phone]

## 2016-11-06 LAB — OB RESULTS CONSOLE GBS: STREP GROUP B AG: NEGATIVE

## 2016-12-02 ENCOUNTER — Inpatient Hospital Stay (HOSPITAL_COMMUNITY)
Admission: AD | Admit: 2016-12-02 | Discharge: 2016-12-03 | DRG: 775 | Disposition: A | Discharge status: Home / Self Care | Payer: Medicaid Other | Source: Ambulatory Visit | Attending: Obstetrics & Gynecology | Admitting: Obstetrics & Gynecology

## 2016-12-02 ENCOUNTER — Encounter (HOSPITAL_COMMUNITY): Payer: Self-pay

## 2016-12-02 DIAGNOSIS — Z3A40 40 weeks gestation of pregnancy: Secondary | ICD-10-CM

## 2016-12-02 DIAGNOSIS — Z3493 Encounter for supervision of normal pregnancy, unspecified, third trimester: Secondary | ICD-10-CM | POA: Diagnosis present

## 2016-12-02 LAB — CBC
HCT: 32.9 % — ABNORMAL LOW (ref 36.0–46.0)
Hemoglobin: 11 g/dL — ABNORMAL LOW (ref 12.0–15.0)
MCH: 27.2 pg (ref 26.0–34.0)
MCHC: 33.4 g/dL (ref 30.0–36.0)
MCV: 81.4 fL (ref 78.0–100.0)
PLATELETS: 276 10*3/uL (ref 150–400)
RBC: 4.04 MIL/uL (ref 3.87–5.11)
RDW: 13.6 % (ref 11.5–15.5)
WBC: 6.9 10*3/uL (ref 4.0–10.5)

## 2016-12-02 LAB — TYPE AND SCREEN
ABO/RH(D): B POS
Antibody Screen: NEGATIVE

## 2016-12-02 LAB — ABO/RH: ABO/RH(D): B POS

## 2016-12-02 MED ORDER — ONDANSETRON HCL 4 MG/2ML IJ SOLN: 4.0000 mg | Freq: Four times a day (QID) | INTRAMUSCULAR | Status: DC | PRN

## 2016-12-02 MED ORDER — BENZOCAINE-MENTHOL 20-0.5 % EX AERO
1.0000 "application " | INHALATION_SPRAY | CUTANEOUS | Status: DC | PRN
  Administered 2016-12-02: 1 via TOPICAL
  Filled 2016-12-02: qty 56

## 2016-12-02 MED ORDER — SODIUM CHLORIDE 0.9 % IV SOLN: 250.0000 mL | INTRAVENOUS | Status: DC | PRN

## 2016-12-02 MED ORDER — DIPHENHYDRAMINE HCL 25 MG PO CAPS: 25.0000 mg | ORAL_CAPSULE | Freq: Four times a day (QID) | ORAL | Status: DC | PRN

## 2016-12-02 MED ORDER — LACTATED RINGERS IV SOLN: 500.0000 mL | INTRAVENOUS | Status: DC | PRN

## 2016-12-02 MED ORDER — SOD CITRATE-CITRIC ACID 500-334 MG/5ML PO SOLN: 30.0000 mL | ORAL | Status: DC | PRN

## 2016-12-02 MED ORDER — DIBUCAINE 1 % RE OINT: 1.0000 "application " | TOPICAL_OINTMENT | RECTAL | Status: DC | PRN

## 2016-12-02 MED ORDER — ONDANSETRON HCL 4 MG/2ML IJ SOLN: 4.0000 mg | INTRAMUSCULAR | Status: DC | PRN

## 2016-12-02 MED ORDER — IBUPROFEN 600 MG PO TABS
600.0000 mg | ORAL_TABLET | Freq: Four times a day (QID) | ORAL | Status: DC
  Administered 2016-12-02 – 2016-12-03 (×5): 600 mg via ORAL
  Filled 2016-12-02 (×5): qty 1

## 2016-12-02 MED ORDER — LACTATED RINGERS IV SOLN: INTRAVENOUS | Status: DC

## 2016-12-02 MED ORDER — SIMETHICONE 80 MG PO CHEW: 80.0000 mg | CHEWABLE_TABLET | ORAL | Status: DC | PRN

## 2016-12-02 MED ORDER — WITCH HAZEL-GLYCERIN EX PADS: 1.0000 "application " | MEDICATED_PAD | CUTANEOUS | Status: DC | PRN

## 2016-12-02 MED ORDER — LIDOCAINE HCL (PF) 1 % IJ SOLN
INTRAMUSCULAR | Status: AC
  Filled 2016-12-02: qty 30

## 2016-12-02 MED ORDER — HYDROXYZINE HCL 50 MG PO TABS
50.0000 mg | ORAL_TABLET | Freq: Four times a day (QID) | ORAL | Status: DC | PRN
  Filled 2016-12-02: qty 1

## 2016-12-02 MED ORDER — TETANUS-DIPHTH-ACELL PERTUSSIS 5-2.5-18.5 LF-MCG/0.5 IM SUSP: 0.5000 mL | Freq: Once | INTRAMUSCULAR | Status: DC

## 2016-12-02 MED ORDER — FENTANYL CITRATE (PF) 100 MCG/2ML IJ SOLN: 50.0000 ug | INTRAMUSCULAR | Status: DC | PRN

## 2016-12-02 MED ORDER — OXYCODONE-ACETAMINOPHEN 5-325 MG PO TABS: 2.0000 | ORAL_TABLET | ORAL | Status: DC | PRN

## 2016-12-02 MED ORDER — SENNOSIDES-DOCUSATE SODIUM 8.6-50 MG PO TABS
2.0000 | ORAL_TABLET | ORAL | Status: DC
  Administered 2016-12-03: 2 via ORAL
  Filled 2016-12-02: qty 2

## 2016-12-02 MED ORDER — MEASLES, MUMPS & RUBELLA VAC ~~LOC~~ INJ
0.5000 mL | INJECTION | Freq: Once | SUBCUTANEOUS | Status: DC
  Filled 2016-12-02: qty 0.5

## 2016-12-02 MED ORDER — SODIUM CHLORIDE 0.9% FLUSH: 3.0000 mL | Freq: Two times a day (BID) | INTRAVENOUS | Status: DC

## 2016-12-02 MED ORDER — LIDOCAINE HCL (PF) 1 % IJ SOLN
30.0000 mL | INTRAMUSCULAR | Status: DC | PRN
  Filled 2016-12-02: qty 30

## 2016-12-02 MED ORDER — COCONUT OIL OIL
1.0000 "application " | TOPICAL_OIL | Status: DC | PRN
  Administered 2016-12-02: 1 via TOPICAL
  Filled 2016-12-02: qty 120

## 2016-12-02 MED ORDER — OXYCODONE-ACETAMINOPHEN 5-325 MG PO TABS: 1.0000 | ORAL_TABLET | ORAL | Status: DC | PRN

## 2016-12-02 MED ORDER — OXYTOCIN 40 UNITS IN LACTATED RINGERS INFUSION - SIMPLE MED: 2.5000 [IU]/h | INTRAVENOUS | Status: DC

## 2016-12-02 MED ORDER — SODIUM CHLORIDE 0.9% FLUSH: 3.0000 mL | INTRAVENOUS | Status: DC | PRN

## 2016-12-02 MED ORDER — ZOLPIDEM TARTRATE 5 MG PO TABS: 5.0000 mg | ORAL_TABLET | Freq: Every evening | ORAL | Status: DC | PRN

## 2016-12-02 MED ORDER — ACETAMINOPHEN 325 MG PO TABS: 650.0000 mg | ORAL_TABLET | ORAL | Status: DC | PRN

## 2016-12-02 MED ORDER — OXYTOCIN 10 UNIT/ML IJ SOLN: 10.0000 [IU] | Freq: Once | INTRAMUSCULAR | Status: DC

## 2016-12-02 MED ORDER — PRENATAL MULTIVITAMIN CH
1.0000 | ORAL_TABLET | Freq: Every day | ORAL | Status: DC
  Administered 2016-12-03: 1 via ORAL
  Filled 2016-12-02: qty 1

## 2016-12-02 MED ORDER — ONDANSETRON HCL 4 MG PO TABS: 4.0000 mg | ORAL_TABLET | ORAL | Status: DC | PRN

## 2016-12-02 MED ORDER — OXYTOCIN BOLUS FROM INFUSION: 500.0000 mL | Freq: Once | INTRAVENOUS | Status: DC

## 2016-12-02 NOTE — Lactation Note (Addendum)
This note was copied from a baby's chart. Lactation Consultation Note  Patient Name: Summer Remigio EisenmengerKarla Ruiz-Garcia MWNUU'VToday's Date: 12/02/2016 Reason for consult: Initial assessment  Baby 6 hours old. Offered Spanish interpreter, mom declined and communicated in English with this LC. Mom reports that she nursed 2 older children over a year each. Mom states that this baby is nursing well. Mom has a room full of visitors. Enc mom to offer lots of STS and nurse with cues. [facility], aware of OP/BFSG and LC phone line assistance after D/C.  Maternal Data Does the patient have breastfeeding experience prior to this delivery?: Yes  Feeding    LATCH Score/Interventions                      Lactation Tools Discussed/Used     Consult Status Consult Status: PRN    Sherlyn HayJennifer D Yasin Ducat 12/02/2016, 5:01 PM

## 2016-12-02 NOTE — H&P (Signed)
Summer Wilkinson is a 31 y.o. female presenting for active labor GBS neg. OB History    Gravida Para Term Preterm AB Living   3 2 2     2    SAB TAB Ectopic Multiple Live Births           2     Past Medical History:  Diagnosis Date  . Gallbladder sludge    Past Surgical History:  Procedure Laterality Date  . bacterial vaginosis  2012  . chlamydia  2006  . NO PAST SURGERIES     Family History: family history includes Diabetes in her sister. Social History:  reports that she has never smoked. She has never used smokeless tobacco. She reports that she does not drink alcohol or use drugs.     Maternal Diabetes: No Genetic Screening: Normal Maternal Ultrasounds/Referrals: Normal Fetal Ultrasounds or other Referrals:  None Maternal Substance Abuse:  No Significant Maternal Medications:  None Significant Maternal Lab Results:  None Other Comments:  None  Review of Systems  Constitutional: Negative.   HENT: Negative.   Eyes: Negative.   Respiratory: Negative.   Cardiovascular: Negative.   Gastrointestinal: Positive for abdominal pain.  Genitourinary: Negative.   Musculoskeletal: Negative.   Skin: Negative.   Neurological: Negative.   Endo/Heme/Allergies: Negative.   Psychiatric/Behavioral: Negative.    Maternal Medical History:  Reason for admission: Contractions.   Contractions: Onset was 3-5 hours ago.   Frequency: regular.   Perceived severity is moderate.    Fetal activity: Perceived fetal activity is normal.   Last perceived fetal movement was within the past hour.    Prenatal complications: no prenatal complications Prenatal Complications - Diabetes: none.    Dilation: 7 Effacement (%): 100 Station: -2 Exam by:: Ginnie Smartachel Schmidt RN Blood pressure 124/68, pulse 83, temperature 97.7 F (36.5 C), resp. rate 20, last menstrual period 03/27/2016, unknown if currently breastfeeding. Maternal Exam:  Uterine Assessment: Contraction strength is firm.   Contraction frequency is regular.   Abdomen: Patient reports no abdominal tenderness. Fetal presentation: vertex  Introitus: Normal vulva. Normal vagina.  Pelvis: adequate for delivery.   Cervix: Cervix evaluated by digital exam.     Fetal Exam Fetal Monitor Review: Mode: ultrasound.   Variability: moderate (6-25 bpm).   Pattern: no decelerations and accelerations present.    Fetal State Assessment: Category I - tracings are normal.     Physical Exam  Constitutional: She is oriented to person, place, and time. She appears well-developed and well-nourished.  HENT:  Head: Normocephalic.  Eyes: Pupils are equal, round, and reactive to light.  Neck: Normal range of motion.  Cardiovascular: Normal rate, regular rhythm, normal heart sounds and intact distal pulses.   Respiratory: Effort normal and breath sounds normal.  GI: Soft. Bowel sounds are normal.  Genitourinary: Vagina normal and uterus normal.  Musculoskeletal: Normal range of motion.  Neurological: She is alert and oriented to person, place, and time. She has normal reflexes.  Skin: Skin is warm and dry.  Psychiatric: She has a normal mood and affect. Her behavior is normal. Judgment and thought content normal.    Prenatal labs: ABO, Rh:   Antibody:   Rubella:   RPR: Non Reactive (12/04 1040)  HBsAg:    HIV: Non Reactive (12/04 1040)  GBS:     Assessment/Plan: Admit Active labor GBS neg   Summer Wilkinson 12/02/2016, 9:49 AM

## 2016-12-02 NOTE — MAU Note (Signed)
Pt started ctx about an hour ago. Stated her last labor lasted about an hour.

## 2016-12-03 LAB — RPR: RPR: NONREACTIVE

## 2016-12-03 MED ORDER — IBUPROFEN 800 MG PO TABS: 800.0000 mg | ORAL_TABLET | Freq: Three times a day (TID) | ORAL | 0 refills | Status: DC | PRN

## 2016-12-03 NOTE — Lactation Note (Signed)
This note was copied from a baby's chart. Lactation Consultation Note  Patient Name: Boy Remigio EisenmengerKarla Ruiz-Garcia ZOXWR'UToday's Date: 12/03/2016 Reason for consult: Follow-up assessment Mom declined Spanish interpreter. Mom reports baby nursing well. LC notes positional stripe on right nipple. Care for sore nipples reviewed, apply EBM/coconut oil prn. Reviewed positioning and encouraged to alternate positions with feedings. Reviewed importance of deep latch. Mom started to supplement, stressed importance of BF with each feeding before giving any bottles to encourage milk production, prevent engorgement and protect milk supply.  Engorgement care reviewed if needed. Hand pump given for home use as needed, flange 27 given for better fit. Mom latched baby at this visit and baby appeared to have good depth demonstrating good suckling bursts with swallows noted. Mom to call for questions/concerns.   Maternal Data    Feeding Feeding Type: Breast Fed  LATCH Score/Interventions Latch: Grasps breast easily, tongue down, lips flanged, rhythmical sucking.  Audible Swallowing: Spontaneous and intermittent  Type of Nipple: Everted at rest and after stimulation  Comfort (Breast/Nipple): Filling, red/small blisters or bruises, mild/mod discomfort  Problem noted: Mild/Moderate discomfort Interventions (Mild/moderate discomfort): Hand massage;Hand expression (EBM/coconut oil to tender nipples. positional stripe right nipple)  Hold (Positioning): No assistance needed to correctly position infant at breast. Intervention(s): Breastfeeding basics reviewed;Support Pillows;Position options;Skin to skin  LATCH Score: 9  Lactation Tools Discussed/Used Tools: Pump;Other (comment) (coconut oil prn) Breast pump type: Manual   Consult Status Consult Status: Complete    Alfred LevinsGranger, Arnita Koons Ann 12/03/2016, 12:29 PM

## 2016-12-03 NOTE — Discharge Instructions (Signed)
Instrucciones para la mamá sobre los cuidados en el hogar °(Home Care Instructions for Mom) °ACTIVIDAD °· Reanude sus actividades regulares de forma gradual. °· Descanse. Tome siestas cuando el bebé duerme. °· No levante objetos que pesen más de 10 libras (4,5 kg) hasta que el médico se lo autorice. °· Evite las actividades que demandan mucho esfuerzo y energía (que son extenuantes) hasta que el médico se lo autorice. Caminar a un ritmo tranquilo a moderado siempre es más seguro. °· Si tuvo un parto por cesárea: °? No pase la aspiradora, suba escaleras o conduzca un vehículo durante 4 o 6 semanas. °? Pídale a alguien que le brinde ayuda con las tareas domésticas hasta que pueda realizarlas por su cuenta. °? Haga ejercicios como se lo haya indicado el médico, si corresponde. ° °HEMORRAGIA VAGINAL °Probablemente continúe sangrando durante 4 o 6 semanas después del parto. Generalmente, la cantidad de sangre disminuye y el color se hace más claro con el transcurso del tiempo. Sin embargo, si usted está demasiado activa, el color de la sangre puede ser rojo brillante. Si necesita cambiarse la compresa higiénica en menos de una hora o tiene coágulos grandes: °· Permanezca acostada. °· Eleve los pies. °· Coloque compresas frías en la zona inferior del abdomen. °· Haga reposo. °· Comuníquese con su médico. °Si está amamantando, podría volver a tener su período entre las 8 semanas después del parto y el momento en que deje de amamantar. Si no está amamantando, volverá a tener su período 6 u 8 semanas después del parto. °CUIDADOS PERINEALES °La zona perineal o perineo, es la parte del cuerpo que se encuentra entre los muslos. Después del parto, esta zona necesita un cuidado especial. Siga las siguientes indicaciones como se lo haya indicado su médico. °· Tome baños de inmersión durante 15 o 20 minutos. °· Utilice apósitos o aerosoles analgésicos y cremas como se lo hayan indicado. °· No utilice tampones ni se haga duchas  vaginales hasta que el sangrado vaginal se haya detenido. °· Cada vez que vaya al baño: °? Use una botella perineal. °? Cámbiese el apósito. °? Use papel tisú en lugar de papel higiénico hasta que se cure la sutura. °· Haga ejercicios de Kegel todos los días. Los ejercicios Kegel ayudan a mantener los músculos que sostienen la vagina, la vejiga y los intestinos. Estos ejercicios se pueden realizar mientras está parada, sentada o acostada. Para hacer los ejercicios de Kegel: °? Tense los músculos del estómago y los que rodean el canal de parto. °? Mantenga esta posición durante unos segundos. °? Relájese. °? Repita hasta hacerlos 5 veces seguidas. °· Para evitar las hemorroides o que estas empeoren: °? Beba suficiente líquido para mantener la orina clara o de color amarillo pálido. °? Evite hacer fuerza al defecar. °? Tome los medicamentos y laxantes de venta libre como se lo haya indicado el médico. °CUIDADO DE LAS MAMAS °· Use un buen sostén. °· Evite tomar analgésicos de venta libre para las molestias de los pechos. °· Aplique hielo en los pechos para aliviar las molestias tanto como sea necesario: °? Ponga el hielo en una bolsa plástica. °? Coloque una toalla entre la piel y la bolsa de hielo. °? Aplique el hielo durante 20, o como se lo haya indicado el médico. ° °NUTRICIÓN °· Mantenga una dieta bien balanceada. °· No intente perder de peso rápidamente reduciendo el consumo de calorías. °· Tome sus vitaminas prenatales hasta el control de postparto o hasta que su médico se lo indique. ° °DEPRESIÓN POSTPARTO °  Puede sentir deseos de llorar sin motivo aparente y verse incapaz de enfrentarse a todos los cambios que implica tener un bebé. Este estado de ánimo se llama depresión postparto. La depresión postparto ocurre porque sus niveles hormonales sufren cambios después del parto. Si usted tiene depresión postparto, busque contención por parte de su pareja, sus amigos y su familia. Si la depresión no desaparece por  sí sola después de algunas semanas, concurra a su médico. °AUTOEXAMEN DE MAMAS °Realícese autoexámenes en el mismo momento cada mes. Si está amamantando, el mejor momento de controlar sus mamas es después de alimentar al bebé, cuando los pechos no están tan llenos. Si está amamantando y su período ya comenzó, controle sus mamas el día 5, 6 o 7 de su período. °Informe a su médico de cualquier protuberancia, bulto o secreción. Si está amamantando, las mamas normalmente tienen bultos. Esto es transitorio y no es un riesgo para la salud. °INTIMIDAD Y SEXUALIDAD °Debe evitar las relaciones sexuales durante al menos 3 o 4 semanas después del parto o hasta que el flujo de color rojo amarronado haya desaparecido completamente. Si no desea quedar embarazada nuevamente, use algún método anticonceptivo. Después del parto, puede quedar embarazada incluso si no ha tenido todavía el período. °SOLICITE ATENCIÓN MÉDICA SI: °· Se siente incapaz de controlar los cambios que implica tener un hijo y esos sentimientos no desaparecen después de algunas semanas. °· Detecta una protuberancia, bulto o secreción en sus mamas. ° °SOLICITE ATENCIÓN MÉDICA DE INMEDIATO SI: °· Debe cambiarse la compresa higiénica en 1 hora o menos. °· Tiene los siguientes síntomas: °? Dolor intenso o calambres en la parte inferior del abdomen. °? Una secreción vaginal con mal olor. °? Fiebre que no se alivia con los medicamentos. °? Una zona de la mama se pone roja y le causa dolor, y además usted tiene fiebre. °? Una pantorrilla enrojecida y con dolor. °? Repentino e intenso dolor en el pecho. °? Falta de aire. °? Micción dolorosa o con sangre. °? Problemas visuales. °· Vómitos durante 12 horas o más. °· Dolor de cabeza intenso. °· Tiene pensamientos serios acerca de lastimarse a usted misma o dañar al niño o a otra persona. ° °Esta información no tiene como fin reemplazar el consejo del médico. Asegúrese de hacerle al médico cualquier pregunta que  tenga. °Document Released: 06/04/2005 Document Revised: 09/26/2015 Document Reviewed: 12/06/2014 °Elsevier Interactive Patient Education © 2017 [employer] ° °

## 2016-12-03 NOTE — Progress Notes (Signed)
Patient discharged with printed instructions. Pt verbalized an understanding. No concerns noted. Kinda Pottle L Gleason Ardoin, RN 

## 2016-12-03 NOTE — Discharge Summary (Signed)
OB Discharge Summary     Patient Name: Summer Wilkinson DOB: 1986-02-20 MRN: [id number]  Date of admission: 12/02/2016 Delivering MD: Wyvonnia Dusky D   Date of discharge: 12/03/2016  Admitting diagnosis: 40wks contractions or less  bleeding  Intrauterine pregnancy: [redacted]w[redacted]d     Secondary diagnosis:  Active Problems:   Normal labor  Additional problems: n/a     Discharge diagnosis: Term Pregnancy Delivered                                                                                                Post partum procedures:n/a  Augmentation: none  Complications: None  Hospital course:  Onset of Labor With Vaginal Delivery     31 y.o. yo [id number] at [redacted]w[redacted]d was admitted in Active Labor on 12/02/2016. Patient had an uncomplicated labor course as follows:  Membrane Rupture Time/Date: 10:04 AM ,12/02/2016   Intrapartum Procedures: Episiotomy: None [1]                                         Lacerations:  None [1]  Patient had a delivery of a Viable infant. 12/02/2016  Information for the patient's newborn:  Rie, Mcneil [[id number]]  Delivery Method: Vaginal, Spontaneous Delivery (Filed from Delivery Summary)    Pateint had an uncomplicated postpartum course.  She is ambulating, tolerating a regular diet, passing flatus, and urinating well. Patient is discharged home in stable condition on 12/03/16.   Physical exam  Vitals:   12/02/16 1100 12/02/16 1142 12/02/16 1239 12/02/16 1645  BP: 118/69 117/81 118/71 (!) 112/52  Pulse: 63 66 65 69  Resp: 18 18 18 18   Temp:  98.6 F (37 C) 97.8 F (36.6 C) 98.9 F (37.2 C)  TempSrc:  Oral Oral Oral  SpO2:  98% 99% 98%  Weight:      Height:       General: alert, cooperative and no distress Lochia: appropriate Uterine Fundus: firm Incision: N/A DVT Evaluation: No evidence of DVT seen on physical exam. Labs: Lab Results  Component Value Date   WBC 6.9 12/02/2016   HGB 11.0 (L) 12/02/2016   HCT 32.9 (L) 12/02/2016    MCV 81.4 12/02/2016   PLT 276 12/02/2016   CMP Latest Ref Rng & Units 09/05/2016  Glucose 65 - 99 mg/dL 80  BUN 6 - 20 mg/dL 10  Creatinine 5.78 - 4.69 mg/dL 6.29(B)  Sodium 284 - 132 mmol/L 134(L)  Potassium 3.5 - 5.1 mmol/L 3.8  Chloride 101 - 111 mmol/L 105  CO2 22 - 32 mmol/L 21(L)  Calcium 8.9 - 10.3 mg/dL 4.4(W)  Total Protein 6.5 - 8.1 g/dL 6.1(L)  Total Bilirubin 0.3 - 1.2 mg/dL 0.4  Alkaline Phos 38 - 126 U/L 74  AST 15 - 41 U/L 24  ALT 14 - 54 U/L 19    Discharge instruction: per After Visit Summary and "Baby and Me Booklet".  After visit meds:  Allergies as of 12/03/2016   No Known Allergies  Medication List    STOP taking these medications   meclizine 25 MG tablet Commonly known as:  ANTIVERT     TAKE these medications   PRENATAL COMPLETE 14-0.4 MG Tabs Take 1 tablet by mouth daily.       Diet: routine diet  Activity: Advance as tolerated. Pelvic rest for 6 weeks.   Outpatient follow up:4-6 weeks Follow up Appt:No future appointments. Follow up Visit:No Follow-up on file.  Postpartum contraception: Progesterone only pills  Newborn Data: Live born female  Birth Weight: 9 lb 2.4 oz (4150 g) APGAR: 7, 9  Baby Feeding: Breast Disposition:home with mother   12/03/2016 Cleone Slimaroline Neill, Student-MidWife  I confirm that I have verified the information documented in the nurse midwife student's note and that I have also personally reperformed the physical exam and all medical decision making activities.   Thressa ShellerHeather Hogan 8:04 AM 12/03/16

## 2017-08-23 ENCOUNTER — Emergency Department (HOSPITAL_COMMUNITY): Payer: Self-pay

## 2017-08-23 ENCOUNTER — Other Ambulatory Visit: Payer: Self-pay

## 2017-08-23 ENCOUNTER — Encounter (HOSPITAL_COMMUNITY): Payer: Self-pay | Admitting: Emergency Medicine

## 2017-08-23 ENCOUNTER — Emergency Department (HOSPITAL_COMMUNITY)
Admission: EM | Admit: 2017-08-23 | Discharge: 2017-08-23 | Disposition: A | Discharge status: Home / Self Care | Payer: Self-pay | Attending: Emergency Medicine | Admitting: Emergency Medicine

## 2017-08-23 DIAGNOSIS — J069 Acute upper respiratory infection, unspecified: Secondary | ICD-10-CM | POA: Insufficient documentation

## 2017-08-23 DIAGNOSIS — B9789 Other viral agents as the cause of diseases classified elsewhere: Secondary | ICD-10-CM

## 2017-08-23 DIAGNOSIS — R05 Cough: Secondary | ICD-10-CM | POA: Insufficient documentation

## 2017-08-23 MED ORDER — PROMETHAZINE-DM 6.25-15 MG/5ML PO SYRP: 5.0000 mL | ORAL_SOLUTION | Freq: Four times a day (QID) | ORAL | 0 refills | Status: DC | PRN

## 2017-08-23 NOTE — ED Triage Notes (Signed)
Pt to ER for evaluation of 1 week dry cough, body aches, chills, and fatigue. Pt in NAD.

## 2017-08-23 NOTE — ED Provider Notes (Signed)
[facility] Provider Note   CSN: [id number] Arrival date & time: 08/23/17  1244     History   Chief Complaint Chief Complaint  Patient presents with  . Cough    HPI Courtney HeysCarla Wilkinson is a 32 y.o. female.  HPI Courtney HeysCarla Wilkinson is a 32 y.o. female presents to emergency department with complaint of flulike symptoms.  Patient reports sore throat, nasal congestion, cough for the last 6 days.  She has been taking over-the-counter cold and flu medications with no relief of her symptoms.  She states that she had a fever on day 1, and again last night which is what made her come to the ED.  She states nothing she has tried making her symptoms better or worse.  She denies any nausea or vomiting.  No diarrhea.  No chest pain or abdominal pain.  She states that her kids and some other close contacts have been sick with similar symptoms.  History reviewed. No pertinent past medical history.  There are no active problems to display for this patient.   History reviewed. No pertinent surgical history.  OB History    No data available       Home Medications    Prior to Admission medications   Not on File    Family History History reviewed. No pertinent family history.  Social History Social History   Tobacco Use  . Smoking status: Never Smoker  . Smokeless tobacco: Never Used  Substance Use Topics  . Alcohol use: Yes  . Drug use: No     Allergies   Peanut-containing drug products   Review of Systems Review of Systems  Constitutional: Positive for chills and fever.  HENT: Positive for congestion and sore throat.   Respiratory: Positive for cough. Negative for chest tightness and shortness of breath.   Cardiovascular: Negative for chest pain, palpitations and leg swelling.  Gastrointestinal: Negative for abdominal pain, diarrhea, nausea and vomiting.  Genitourinary: Negative for dysuria, flank pain, pelvic pain, vaginal bleeding, vaginal  discharge and vaginal pain.  Musculoskeletal: Negative for arthralgias, myalgias, neck pain and neck stiffness.  Skin: Negative for rash.  Neurological: Negative for dizziness, weakness and headaches.  All other systems reviewed and are negative.    Physical Exam Updated Vital Signs BP 119/84 (BP Location: Right Arm)   Pulse 100   Temp 98.6 F (37 C) (Oral)   Resp 18   LMP 08/19/2017   SpO2 99%   Physical Exam  Constitutional: She is oriented to person, place, and time. She appears well-developed and well-nourished. No distress.  HENT:  Head: Normocephalic and atraumatic.  Right Ear: Tympanic membrane, external ear and ear canal normal.  Left Ear: Tympanic membrane, external ear and ear canal normal.  Nose: Mucosal edema and rhinorrhea present.  Mouth/Throat: Uvula is midline and mucous membranes are normal. Posterior oropharyngeal erythema present. No oropharyngeal exudate, posterior oropharyngeal edema or tonsillar abscesses.  Eyes: Conjunctivae are normal.  Neck: Neck supple.  Cardiovascular: Normal rate, regular rhythm, normal heart sounds and intact distal pulses.  Pulmonary/Chest: Effort normal and breath sounds normal. No respiratory distress. She has no wheezes. She has no rales.  Abdominal: She exhibits no distension.  Musculoskeletal: Normal range of motion.  Neurological: She is alert and oriented to person, place, and time.  Skin: Skin is warm and dry.  Psychiatric: She has a normal mood and affect.  Nursing note and vitals reviewed.    ED Treatments / Results  Labs (all  labs ordered are listed, but only abnormal results are displayed) Labs Reviewed - No data to display  EKG  EKG Interpretation None       Radiology Dg Chest 2 View  Result Date: 08/23/2017 CLINICAL DATA:  Fever and cough for 6 days with headache and dizziness for 2-3 days. EXAM: CHEST - 2 VIEW COMPARISON:  None. FINDINGS: The heart size and mediastinal contours are within normal limits.  Both lungs are clear. The visualized skeletal structures are unremarkable. IMPRESSION: No active cardiopulmonary disease. Electronically Signed   By: Tollie Eth M.D.   On: 08/23/2017 13:38    Procedures Procedures (including critical care time)  Medications Ordered in ED Medications - No data to display   Initial Impression / Assessment and Plan / ED Course  I have reviewed the triage vital signs and the nursing notes.  Pertinent labs & imaging results that were available during my care of the patient were reviewed by me and considered in my medical decision making (see chart for details).     Patient in emergency department with flulike symptoms.  Exam unremarkable.  Patient is in no acute distress.  Her vital signs are normal other than heart rate of 100.  She is afebrile here.  Oxygen saturation is 99% on room air.  She is nontoxic-appearing.  Symptoms most likely viral in nature, possibly influenza.  This was discussed with patient.  Discussed symptomatic treatment with Tylenol/Motrin, will provide with cough medication, rest, oral fluids, follow-up with family doctor as needed.  Patient agreed to the plan, asked for a work note  Vitals:   08/23/17 1304  BP: 119/84  Pulse: 100  Resp: 18  Temp: 98.6 F (37 C)  TempSrc: Oral  SpO2: 99%     Final Clinical Impressions(s) / ED Diagnoses   Final diagnoses:  Viral URI with cough    ED Discharge Orders        Ordered    promethazine-dextromethorphan (PROMETHAZINE-DM) 6.25-15 MG/5ML syrup  4 times daily PRN     08/23/17 1511       Jaynie Crumble, PA-C 08/23/17 1512    Charlynne Pander, MD 08/26/17 [phone]

## 2017-08-23 NOTE — Discharge Instructions (Signed)
Take Tylenol or Motrin for pain and fever.  Rest.  Drink plenty of fluids.  Take cough medicine as prescribed, this medication will make you sleepy, do not drive if taking.  Follow-up with family doctor.  Return if worsening

## 2017-10-02 ENCOUNTER — Encounter (HOSPITAL_COMMUNITY): Payer: Self-pay | Admitting: Emergency Medicine

## 2017-12-04 ENCOUNTER — Emergency Department (HOSPITAL_COMMUNITY)
Admission: EM | Admit: 2017-12-04 | Discharge: 2017-12-04 | Disposition: A | Discharge status: Home / Self Care | Payer: Self-pay | Attending: Emergency Medicine | Admitting: Emergency Medicine

## 2017-12-04 ENCOUNTER — Other Ambulatory Visit: Payer: Self-pay

## 2017-12-04 ENCOUNTER — Encounter (HOSPITAL_COMMUNITY): Payer: Self-pay | Admitting: Emergency Medicine

## 2017-12-04 ENCOUNTER — Emergency Department (HOSPITAL_COMMUNITY): Payer: Self-pay

## 2017-12-04 DIAGNOSIS — S63502A Unspecified sprain of left wrist, initial encounter: Secondary | ICD-10-CM | POA: Insufficient documentation

## 2017-12-04 DIAGNOSIS — Z91010 Allergy to peanuts: Secondary | ICD-10-CM | POA: Insufficient documentation

## 2017-12-04 DIAGNOSIS — Y939 Activity, unspecified: Secondary | ICD-10-CM | POA: Insufficient documentation

## 2017-12-04 DIAGNOSIS — Y999 Unspecified external cause status: Secondary | ICD-10-CM | POA: Insufficient documentation

## 2017-12-04 DIAGNOSIS — X501XXA Overexertion from prolonged static or awkward postures, initial encounter: Secondary | ICD-10-CM | POA: Insufficient documentation

## 2017-12-04 DIAGNOSIS — Z79899 Other long term (current) drug therapy: Secondary | ICD-10-CM | POA: Insufficient documentation

## 2017-12-04 DIAGNOSIS — Y929 Unspecified place or not applicable: Secondary | ICD-10-CM | POA: Insufficient documentation

## 2017-12-04 MED ORDER — IBUPROFEN 800 MG PO TABS: 800.0000 mg | ORAL_TABLET | Freq: Three times a day (TID) | ORAL | 0 refills | Status: AC | PRN

## 2017-12-04 MED ORDER — IBUPROFEN 800 MG PO TABS
800.0000 mg | ORAL_TABLET | Freq: Once | ORAL | Status: AC
  Administered 2017-12-04: 800 mg via ORAL
  Filled 2017-12-04: qty 1

## 2017-12-04 NOTE — Progress Notes (Signed)
Orthopedic Tech Progress Note Patient Details:  Summer Wilkinson [birth year] [id number]  Ortho Devices Type of Ortho Device: Velcro wrist splint Ortho Device/Splint Location: lue Ortho Device/Splint Interventions: Application   Post Interventions Patient Tolerated: Well Instructions Provided: Care of device   Nikki DomCrawford, Ezel Vallone 12/04/2017, 8:10 AM

## 2017-12-04 NOTE — ED Notes (Signed)
Ortho paged for wrist splint 

## 2017-12-04 NOTE — ED Notes (Signed)
Patient verbalizes understanding of discharge instructions. Opportunity for questioning and answers were provided. Armband removed by staff, pt discharged from ED ambulatory.   

## 2017-12-04 NOTE — ED Provider Notes (Signed)
[facility] EMERGENCY DEPARTMENT Provider Note  CSN: [id number] Arrival date & time: 12/04/17  8295  History   Chief Complaint Chief Complaint  Patient presents with  . Hand Injury   HPI Summer Wilkinson is a 32 y.o. female with no signifcant medical history who presented to the ED for left wrist pain x2 hours. Patient states she twisted her wrist while holding her 1 yo baby this morning. She describes constant throbbing pain that goes from her wrist to her mid-forearm. Denies any deformity, swelling, erythema or paresthesias. Patient has tried nothing prior to coming to the ED.  Past Medical History:  Diagnosis Date  . Gallbladder sludge     Patient Active Problem List   Diagnosis Date Noted  . Normal labor 12/02/2016    Past Surgical History:  Procedure Laterality Date  . bacterial vaginosis  2012  . chlamydia  2006  . NO PAST SURGERIES       OB History    Gravida  3   Para  3   Term  3   Preterm  0   AB  0   Living  2     SAB  0   TAB  0   Ectopic  0   Multiple      Live Births  2            Home Medications    Prior to Admission medications   Medication Sig Start Date End Date Taking? Authorizing Provider  ibuprofen (ADVIL,MOTRIN) 800 MG tablet Take 1 tablet (800 mg total) by mouth every 8 (eight) hours as needed for up to 10 days for moderate pain. 12/04/17 12/14/17  Pasty Manninen, Jerrel Ivory I, PA-C  Prenatal Vit-Fe Fumarate-FA (PRENATAL COMPLETE) 14-0.4 MG TABS Take 1 tablet by mouth daily. 05/21/16   Garlon Hatchet, PA-C  promethazine-dextromethorphan (PROMETHAZINE-DM) 6.25-15 MG/5ML syrup Take 5 mLs by mouth 4 (four) times daily as needed for cough. 08/23/17   Jaynie Crumble, PA-C    Family History Family History  Problem Relation Age of Onset  . Diabetes Sister   . Colon cancer Neg Hx     Social History Social History   Tobacco Use  . Smoking status: Never Smoker  . Smokeless tobacco: Never Used  Substance Use  Topics  . 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 Drug use: No     Allergies   Peanut-containing drug products   Review of Systems Review of Systems  Constitutional: Negative.   Endocrine: Negative.   Musculoskeletal: Positive for arthralgias. Negative for joint swelling.  Skin: Negative.   Neurological: Negative for weakness and numbness.  Hematological: Negative.      Physical Exam Updated Vital Signs BP 92/67 (BP Location: Right Arm)   Pulse 66   Temp 98.3 F (36.8 C) (Oral)   Resp 18   Ht 5\' 1"  (1.549 m)   Wt 65.8 kg (145 lb)   LMP 11/27/2017 (Approximate)   SpO2 99%   BMI 27.40 kg/m   Physical Exam  Constitutional: She appears well-developed and well-nourished.  Musculoskeletal:       Left elbow: Normal.       Left wrist: She exhibits tenderness. She exhibits normal range of motion, no bony tenderness, no swelling and no deformity.       Left forearm: She exhibits tenderness. She exhibits no swelling, no edema and no deformity.       Left hand:  Normal. She exhibits normal capillary refill. Normal sensation noted. Normal strength noted.  Neurological: She has normal strength. No sensory deficit. She exhibits normal muscle tone.  Reflex Scores:      Bicep reflexes are 2+ on the right side and 2+ on the left side.      Brachioradialis reflexes are 2+ on the right side and 2+ on the left side. Skin: Skin is warm and intact. Capillary refill takes less than 2 seconds. No bruising and no ecchymosis noted.  Nursing note and vitals reviewed.    ED Treatments / Results  Labs (all labs ordered are listed, but only abnormal results are displayed) Labs Reviewed - No data to display  EKG None  Radiology Dg Wrist Complete Left  Result Date: 12/04/2017 CLINICAL DATA:  Twisted left wrist while holding a baby. Medial left wrist pain. EXAM: LEFT WRIST - COMPLETE 3+ VIEW COMPARISON:  None. FINDINGS: There is no evidence of  fracture or dislocation. There is no evidence of arthropathy or other focal bone abnormality. Soft tissues are unremarkable. IMPRESSION: Negative. Electronically Signed   By: Burman NievesWilliam  Stevens M.D.   On: 12/04/2017 06:49    Procedures Procedures (including critical care time)  Medications Ordered in ED Medications  ibuprofen (ADVIL,MOTRIN) tablet 800 mg (800 mg Oral Given 12/04/17 0805)     Initial Impression / Assessment and Plan / ED Course  Triage vital signs and the nursing notes have been reviewed.  Pertinent labs & imaging results that were available during care of the patient were reviewed and considered in medical decision making (see chart for details).  Patient presents in no acute distress. No obvious deformities or bony tenderness on physical exam. Patient's neurovascular exam in affected wrist is normal and she has full active and passive ROM which is reassuring.  Clinical Course as of Dec 05 830  Wed Dec 04, 2017  0742 Wrist x-ray normal. No fractures or dislocations visualized. No scaphoid tenderness or deformities seen on exam. Muscular sprain diagnosis.   [GM]    Clinical Course User Index [GM] Sharetha Newson, Sharyon MedicusGabrielle I, PA-C    Final Clinical Impressions(s) / ED Diagnoses  1. Left Wrist Sprain. Wrist brace applied in the ED. Ibuprofen 800mg  Q8H PRN prescribed. Education on OTC and supportive treatments for symptom relief.  Dispo: Home. After thorough clinical evaluation, this patient is determined to be medically stable and can be safely discharged with the previously mentioned treatment and/or outpatient follow-up/referral(s). At this time, there are no other apparent medical conditions that require further screening, evaluation or treatment.   Final diagnoses:  Left wrist sprain, initial encounter    ED Discharge Orders        Ordered    ibuprofen (ADVIL,MOTRIN) 800 MG tablet  Every 8 hours PRN     12/04/17 0757        Windy CarinaMortis, Val Schiavo I, PA-C 12/04/17  [id number]    Mesner, Barbara CowerJason, MD 12/05/17 1028

## 2017-12-04 NOTE — Discharge Instructions (Addendum)
I would not be surprised if your wrist feels a little more sore tomorrow. You may take ibuprofen 800mg  up to three times a day. You can also continue to ice your wrist in 15-20 minute intervals.   Limit how much heavy lifting you are doing at work for the next few days. Brace can be worn at work. You may take the brace off to sleep and to shower. You can wear the brace for up to 2 weeks.

## 2017-12-04 NOTE — ED Notes (Signed)
Ortho on their way to place splint on left wrist.

## 2017-12-04 NOTE — ED Triage Notes (Addendum)
Pt reports pain to posterior base of left hand/wrist after twisting it just prior to arrival, denies numbness or tingling, no obvious deformity

## 2018-04-24 ENCOUNTER — Encounter (HOSPITAL_COMMUNITY): Payer: Self-pay | Admitting: Emergency Medicine

## 2018-04-24 ENCOUNTER — Ambulatory Visit (HOSPITAL_COMMUNITY): Payer: Self-pay

## 2018-04-24 ENCOUNTER — Other Ambulatory Visit: Payer: Self-pay

## 2018-04-24 ENCOUNTER — Emergency Department (HOSPITAL_COMMUNITY)
Admission: EM | Admit: 2018-04-24 | Discharge: 2018-04-24 | Disposition: A | Discharge status: Home / Self Care | Payer: Self-pay | Attending: Emergency Medicine | Admitting: Emergency Medicine

## 2018-04-24 DIAGNOSIS — N390 Urinary tract infection, site not specified: Secondary | ICD-10-CM | POA: Insufficient documentation

## 2018-04-24 DIAGNOSIS — R319 Hematuria, unspecified: Secondary | ICD-10-CM

## 2018-04-24 LAB — CBC WITH DIFFERENTIAL/PLATELET
ABS IMMATURE GRANULOCYTES: 0.03 10*3/uL (ref 0.00–0.07)
BASOS ABS: 0 10*3/uL (ref 0.0–0.1)
Basophils Relative: 0 %
EOS ABS: 0.1 10*3/uL (ref 0.0–0.5)
Eosinophils Relative: 1 %
HEMATOCRIT: 34.6 % — AB (ref 36.0–46.0)
Hemoglobin: 11 g/dL — ABNORMAL LOW (ref 12.0–15.0)
IMMATURE GRANULOCYTES: 0 %
LYMPHS ABS: 1.4 10*3/uL (ref 0.7–4.0)
LYMPHS PCT: 14 %
MCH: 28.9 pg (ref 26.0–34.0)
MCHC: 31.8 g/dL (ref 30.0–36.0)
MCV: 91.1 fL (ref 80.0–100.0)
Monocytes Absolute: 0.6 10*3/uL (ref 0.1–1.0)
Monocytes Relative: 6 %
NEUTROS ABS: 8.1 10*3/uL — AB (ref 1.7–7.7)
Neutrophils Relative %: 79 %
Platelets: 261 10*3/uL (ref 150–400)
RBC: 3.8 MIL/uL — ABNORMAL LOW (ref 3.87–5.11)
RDW: 13 % (ref 11.5–15.5)
WBC: 10.2 10*3/uL (ref 4.0–10.5)
nRBC: 0 % (ref 0.0–0.2)

## 2018-04-24 LAB — URINALYSIS, ROUTINE W REFLEX MICROSCOPIC
BILIRUBIN URINE: NEGATIVE
Glucose, UA: NEGATIVE mg/dL
KETONES UR: 20 mg/dL — AB
Nitrite: NEGATIVE
PROTEIN: 100 mg/dL — AB
Specific Gravity, Urine: 1.02 (ref 1.005–1.030)
WBC, UA: >50 WBC/hpf — ABNORMAL HIGH (ref 0–5)
pH: 6 (ref 5.0–8.0)

## 2018-04-24 LAB — COMPREHENSIVE METABOLIC PANEL
ALBUMIN: 3.1 g/dL — AB (ref 3.5–5.0)
ALT: 16 U/L (ref 0–44)
AST: 16 U/L (ref 15–41)
Alkaline Phosphatase: 45 U/L (ref 38–126)
Anion gap: 9 (ref 5–15)
BILIRUBIN TOTAL: 0.6 mg/dL (ref 0.3–1.2)
BUN: 10 mg/dL (ref 6–20)
CHLORIDE: 104 mmol/L (ref 98–111)
CO2: 21 mmol/L — ABNORMAL LOW (ref 22–32)
Calcium: 8.7 mg/dL — ABNORMAL LOW (ref 8.9–10.3)
Creatinine, Ser: 0.39 mg/dL — ABNORMAL LOW (ref 0.44–1.00)
GFR calc Af Amer: >60 mL/min (ref >60)
GFR calc non Af Amer: >60 mL/min (ref >60)
GLUCOSE: 87 mg/dL (ref 70–99)
Potassium: 3.3 mmol/L — ABNORMAL LOW (ref 3.5–5.1)
Sodium: 134 mmol/L — ABNORMAL LOW (ref 135–145)
Total Protein: 6.3 g/dL — ABNORMAL LOW (ref 6.5–8.1)

## 2018-04-24 LAB — WET PREP, GENITAL
Clue Cells Wet Prep HPF POC: NONE SEEN
Sperm: NONE SEEN
Trich, Wet Prep: NONE SEEN
YEAST WET PREP: NONE SEEN

## 2018-04-24 LAB — HCG, QUANTITATIVE, PREGNANCY: HCG, BETA CHAIN, QUANT, S: 73638 m[IU]/mL — AB (ref <5)

## 2018-04-24 LAB — LIPASE, BLOOD: Lipase: 40 U/L (ref 11–51)

## 2018-04-24 MED ORDER — CEPHALEXIN 500 MG PO CAPS: 500.0000 mg | ORAL_CAPSULE | Freq: Two times a day (BID) | ORAL | 0 refills | Status: AC

## 2018-04-24 MED ORDER — ACETAMINOPHEN 325 MG PO TABS
650.0000 mg | ORAL_TABLET | Freq: Once | ORAL | Status: AC
  Administered 2018-04-24: 650 mg via ORAL
  Filled 2018-04-24: qty 2

## 2018-04-24 NOTE — ED Provider Notes (Signed)
[facility] EMERGENCY DEPARTMENT Provider Note   CSN: [id number] Arrival date & time: 04/24/18  1459     History   Chief Complaint Chief Complaint  Patient presents with  . Vaginal Bleeding    HPI Summer Wilkinson is a 32 y.o. female.  HPI Patient is a 32 year old female with a past medical history of gallbladder sludge, currently [redacted] weeks pregnant presents to the emergency department for evaluation of lower abdominal pain.  Patient reports a sharp lower abdominal pain that has been intermittently present for approximately 3 days.  Patient reports that since the onset of her symptoms and has been getting progressively worse. States that her pain is now constant.  Describes the pain as sharp and located in the suprapubic area.  Pain does not radiate.  Pain seems to be worse when urinating or with palpation of the area.  She also reports that she is noticed some bleeding with urination, increased urgency, and increased frequency.  Denies any vaginal bleeding or vaginal discharge.  She has had no nausea, no vomiting, and no known fevers.  She has had intermittent chills. Remaining ROS is detailed below.   Past Medical History:  Diagnosis Date  . Gallbladder sludge     Patient Active Problem List   Diagnosis Date Noted  . Normal labor 12/02/2016    Past Surgical History:  Procedure Laterality Date  . bacterial vaginosis  2012  . chlamydia  2006  . NO PAST SURGERIES       OB History    Gravida  4   Para  3   Term  3   Preterm  0   AB  0   Living  2     SAB  0   TAB  0   Ectopic  0   Multiple      Live Births  2            Home Medications    Prior to Admission medications   Medication Sig Start Date End Date Taking? Authorizing Provider  cephALEXin (KEFLEX) 500 MG capsule Take 1 capsule (500 mg total) by mouth 2 (two) times daily for 7 days. 04/24/18 05/01/18  Marcio Hoque, Winfield Rast, MD    Family History Family History  Problem  Relation Age of Onset  . Diabetes Sister   . Colon cancer Neg Hx     Social History Social History   Tobacco Use  . Smoking status: Never Smoker  . Smokeless tobacco: Never Used  Substance Use Topics  . 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 Drug use: No     Allergies   Peanut-containing drug products   Review of Systems Review of Systems  Constitutional: Positive for chills. Negative for fever.  HENT: Negative for ear pain and sore throat.   Eyes: Negative for pain and visual disturbance.  Respiratory: Negative for cough and shortness of breath.   Cardiovascular: Negative for chest pain and palpitations.  Gastrointestinal: Positive for abdominal pain. Negative for vomiting.  Genitourinary: Positive for frequency, hematuria and urgency. Negative for dysuria, flank pain and vaginal bleeding (she states that her bleeding is from her urethra with urination. ).  Musculoskeletal: Negative for arthralgias and back pain.  Skin: Negative for color change and rash.  Neurological: Negative for seizures and syncope.  All other systems reviewed and are negative.    Physical Exam Updated Vital Signs BP 110/71 (BP Location: Right  Arm)   Pulse 87   Temp 98.4 F (36.9 C) (Oral)   Resp 16   Ht 5\' 1"  (1.549 m)   Wt 68 kg   SpO2 100%   BMI 28.34 kg/m   Physical Exam  Constitutional: She appears well-developed and well-nourished. No distress.  HENT:  Head: Normocephalic and atraumatic.  Eyes: Conjunctivae are normal.  Neck: Neck supple.  Cardiovascular: Normal rate and regular rhythm.  No murmur heard. Pulmonary/Chest: Effort normal and breath sounds normal. No respiratory distress.  Abdominal: Soft. There is tenderness (Suprapubic). There is no rigidity, no CVA tenderness and negative Murphy's sign.  Genitourinary: Vagina normal. Pelvic exam was performed with patient supine. There is no rash on the right labia. There is no rash on the  left labia. Uterus is not tender. Cervix exhibits no motion tenderness and no discharge. Right adnexum displays no mass, no tenderness and no fullness. Left adnexum displays no mass, no tenderness and no fullness. No vaginal discharge found.  Musculoskeletal: She exhibits no edema.  Neurological: She is alert.  Skin: Skin is warm and dry.  Psychiatric: She has a normal mood and affect.  Nursing note and vitals reviewed.    ED Treatments / Results  Labs (all labs ordered are listed, but only abnormal results are displayed) Labs Reviewed  WET PREP, GENITAL - Abnormal; Notable for the following components:      Result Value   WBC, Wet Prep HPF POC MANY (*)    All other components within normal limits  CBC WITH DIFFERENTIAL/PLATELET - Abnormal; Notable for the following components:   RBC 3.80 (*)    Hemoglobin 11.0 (*)    HCT 34.6 (*)    Neutro Abs 8.1 (*)    All other components within normal limits  COMPREHENSIVE METABOLIC PANEL - Abnormal; Notable for the following components:   Sodium 134 (*)    Potassium 3.3 (*)    CO2 21 (*)    Creatinine, Ser 0.39 (*)    Calcium 8.7 (*)    Total Protein 6.3 (*)    Albumin 3.1 (*)    All other components within normal limits  URINALYSIS, ROUTINE W REFLEX MICROSCOPIC - Abnormal; Notable for the following components:   APPearance CLOUDY (*)    Hgb urine dipstick LARGE (*)    Ketones, ur 20 (*)    Protein, ur 100 (*)    Leukocytes, UA MODERATE (*)    RBC / HPF >50 (*)    WBC, UA >50 (*)    Bacteria, UA FEW (*)    All other components within normal limits  HCG, QUANTITATIVE, PREGNANCY - Abnormal; Notable for the following components:   hCG, Beta Chain, Quant, [place] 16,109 (*)    All other components within normal limits  URINE CULTURE  LIPASE, BLOOD  GC/CHLAMYDIA PROBE AMP ([place]) NOT AT [facility]    EKG None  Radiology US Ob Limited > 14 Wks  Result Date: 04/24/2018 CLINICAL DATA:  Pregnant patient.  Vaginal bleeding. EXAM: LIMITED  OBSTETRIC ULTRASOUND FINDINGS: Number of Fetuses: 1 Heart Rate:  147 bpm Movement: Yes Presentation: Transverse Placental Location: Lateral on the right. Previa: No Amniotic Fluid (Subjective):  Within normal limits. BPD: 3.3 cm 16 w  2 d MATERNAL FINDINGS: Cervix:  Appears closed. Uterus/Adnexae: No uterine masses or abnormalities. No adnexal masses. Normal size of the right ovary. Left ovary not visualized. No pelvic free fluid. IMPRESSION: 1. Single live intrauterine pregnancy with a measured gestational age of [redacted] weeks  and 2 days. 2. No pregnancy complication. This exam is performed on an emergent basis and does not comprehensively evaluate fetal size, dating, or anatomy; follow-up complete OB US should be considered if further fetal assessment is warranted. Electronically Signed   By: Amie Portland M.D.   On: 04/24/2018 16:09    Procedures Procedures (including critical care time)  Medications Ordered in ED Medications  acetaminophen (TYLENOL) tablet 650 mg (650 mg Oral Given 04/24/18 1847)     Initial Impression / Assessment and Plan / ED Course  I have reviewed the triage vital signs and the nursing notes.  Pertinent labs & imaging results that were available during my care of the patient were reviewed by me and considered in my medical decision making (see chart for details).     Patient is a 32 year old female who presents to the emergency department for evaluation of hematuria.  Patient is also had associated suprapubic abdominal pain as well as increased urinary frequency and urgency.  Secondary to patient being pregnant and ultrasound of her pelvis was obtained which was reassuring.  Multiple laboratory studies were obtained and her UA does show blood and bacteria.  Her other labs are overall reassuring.  Her pelvic exam does not show any significant abnormalities.  Given patient's presenting complaint as well as her UA findings I believe patient most likely is suffering from  hemorrhagic cystitis at this time.  She does not have any CVA tenderness to suggest pyelonephritis at this time.  She has no cervical motion tenderness, vaginal discharge, and a normal pelvic ultrasound which makes cervicitis or PID unlikely.  Patient's pelvic ultrasound also reveals a normal intrauterine pregnancy without complication making a OB cause of her symptoms unlikely at this time.  Patient will be discharged from the emergency department with a prescription for Keflex.  She will follow-up with her OB/GYN in the next 1 to 2 days for reevaluation.  Strict return precautions given prior to discharge.  The care of this patient was discussed with my attending physician Dr. Rush Landmark, who voices agreement with work-up and ED disposition.  Final Clinical Impressions(s) / ED Diagnoses   Final diagnoses:  Urinary tract infection with hematuria, site unspecified    ED Discharge Orders         Ordered    cephALEXin (KEFLEX) 500 MG capsule  2 times daily     04/24/18 1849           Zareena Willis, Winfield Rast, MD 04/25/18 1301    Tegeler, Canary Brim, MD 04/25/18 1538

## 2018-04-24 NOTE — ED Triage Notes (Signed)
Pt arrives to ED from home with complaints of vaginal bleeding that started yesterday and pain with urination starting three days ago. Pt reports she is [redacted] weeks pregnant. Pt placed in position of comfort with bed locked and lowered, call bell in reach.

## 2018-04-24 NOTE — ED Notes (Signed)
Pt ambulated with steady gait to the bathroom.

## 2018-04-24 NOTE — ED Notes (Signed)
Patient transported to Ultrasound 

## 2018-04-25 LAB — GC/CHLAMYDIA PROBE AMP (~~LOC~~) NOT AT ARMC
Chlamydia: NEGATIVE
Neisseria Gonorrhea: NEGATIVE

## 2018-04-26 LAB — URINE CULTURE

## 2018-04-27 ENCOUNTER — Telehealth: Payer: Self-pay

## 2018-04-27 NOTE — Telephone Encounter (Signed)
Post ED Visit - Positive Culture Follow-up  Culture report reviewed by antimicrobial stewardship pharmacist:  []  Enzo Bi, Pharm.D. []  Celedonio Miyamoto, Pharm.D., BCPS AQ-ID []  Garvin Fila, Pharm.D., BCPS []  Georgina Pillion, Pharm.D., BCPS []  [place], [address].D., BCPS, AAHIVP []  Estella Husk, Pharm.D., BCPS, AAHIVP [x]  Lysle Pearl, PharmD, BCPS []  Phillips Climes, PharmD, BCPS []  Agapito Games, PharmD, BCPS []  Verlan Friends, PharmD  Positive urine culture Treated with Cephalexin, organism sensitive to the same and no further patient follow-up is required at this time.  Jerry Caras 04/27/2018, 10:04 AM

## 2018-05-05 LAB — OB RESULTS CONSOLE HEPATITIS B SURFACE ANTIGEN: Hepatitis B Surface Ag: NEGATIVE

## 2018-05-05 LAB — OB RESULTS CONSOLE RUBELLA ANTIBODY, IGM: Rubella: IMMUNE

## 2018-05-19 ENCOUNTER — Encounter (HOSPITAL_COMMUNITY): Payer: Self-pay

## 2018-06-02 ENCOUNTER — Other Ambulatory Visit: Payer: Self-pay | Admitting: Obstetrics and Gynecology

## 2018-06-02 DIAGNOSIS — Z3689 Encounter for other specified antenatal screening: Secondary | ICD-10-CM

## 2018-06-02 DIAGNOSIS — Z3A21 21 weeks gestation of pregnancy: Secondary | ICD-10-CM

## 2018-06-04 ENCOUNTER — Encounter (HOSPITAL_COMMUNITY): Payer: Self-pay | Admitting: *Deleted

## 2018-06-09 ENCOUNTER — Ambulatory Visit (HOSPITAL_COMMUNITY)
Admission: RE | Admit: 2018-06-09 | Discharge: 2018-06-09 | Disposition: A | Discharge status: Home / Self Care | Payer: Self-pay | Source: Ambulatory Visit | Attending: Obstetrics and Gynecology | Admitting: Obstetrics and Gynecology

## 2018-06-09 ENCOUNTER — Other Ambulatory Visit: Payer: Self-pay | Admitting: Obstetrics

## 2018-06-09 DIAGNOSIS — O289 Unspecified abnormal findings on antenatal screening of mother: Secondary | ICD-10-CM

## 2018-06-09 DIAGNOSIS — Z363 Encounter for antenatal screening for malformations: Secondary | ICD-10-CM | POA: Insufficient documentation

## 2018-06-09 DIAGNOSIS — Z3689 Encounter for other specified antenatal screening: Secondary | ICD-10-CM

## 2018-06-09 DIAGNOSIS — Z3A21 21 weeks gestation of pregnancy: Secondary | ICD-10-CM

## 2018-06-10 ENCOUNTER — Other Ambulatory Visit (HOSPITAL_COMMUNITY): Payer: Self-pay | Admitting: *Deleted

## 2018-06-10 DIAGNOSIS — O365920 Maternal care for other known or suspected poor fetal growth, second trimester, not applicable or unspecified: Secondary | ICD-10-CM

## 2018-06-18 NOTE — L&D Delivery Note (Signed)
OB/GYN Faculty Practice Delivery Note  Summer Wilkinson is a 33 y.o. [id number] s/p SVD at [redacted]w[redacted]d. She was admitted for threatened preterm labor.   ROM: 0h 65m with clear fluid GBS Status: unknown - no treatment given  Maximum Maternal Temperature: Temp (24hrs), Avg:97.5 F (36.4 C), Min:97.4 F (36.3 C), Max:97.8 F (36.6 C)  Labor Progress: . Presented to MAU with preterm labor, given nifedipine and fluids with minimal improvement in contraction pattern . Given BMZ for lung maturity  . Admitted in active labor . Mg++ was started as tocolytic to see if labor would slow enough for transfer  . Progressed to complete, AROM clear fluid with cervical check   Delivery Date/Time: 08/31/18 Delivery: Called to room and patient was complete and pushing. Head delivered ROA. No nuchal cord present. Shoulder and body delivered in usual fashion. Infant with spontaneous cry, placed on mother's abdomen, dried and stimulated. Cord clamped x 2 after 1-minute delay, and cut by father of baby. Cord blood drawn. Placenta delivered spontaneously with gentle cord traction. Fundus firm with massage and Pitocin. Labia, perineum, vagina, and cervix inspected inspected with no lacerations.   Placenta: spontaneous, intact, 3-vessel cord  Complications: preterm delivery, NICU present Lacerations: none EBL: 85cc Analgesia: none  Postpartum Planning [/] message to sent to schedule follow-up  [x]  vaccines UTD  Infant: Vigorous female  APGARs note yet entered by team but vigorous at birth with spontaneous respirations noted, routine resuscitation by NICU in room, please see delivery summary for APGARs  weight pending, appears AGA  to NICU given premature status   Caylor Tallarico S. Earlene Plater, DO OB/GYN Fellow, Faculty Practice

## 2018-07-07 ENCOUNTER — Ambulatory Visit (HOSPITAL_COMMUNITY): Payer: Self-pay

## 2018-07-08 ENCOUNTER — Ambulatory Visit (HOSPITAL_COMMUNITY)
Admission: RE | Admit: 2018-07-08 | Discharge: 2018-07-08 | Disposition: A | Discharge status: Home / Self Care | Payer: Self-pay | Source: Ambulatory Visit | Attending: Obstetrics and Gynecology | Admitting: Obstetrics and Gynecology

## 2018-07-08 DIAGNOSIS — Z3A25 25 weeks gestation of pregnancy: Secondary | ICD-10-CM

## 2018-07-08 DIAGNOSIS — O365920 Maternal care for other known or suspected poor fetal growth, second trimester, not applicable or unspecified: Secondary | ICD-10-CM | POA: Insufficient documentation

## 2018-07-08 DIAGNOSIS — Z362 Encounter for other antenatal screening follow-up: Secondary | ICD-10-CM

## 2018-07-08 DIAGNOSIS — O289 Unspecified abnormal findings on antenatal screening of mother: Secondary | ICD-10-CM

## 2018-08-05 LAB — OB RESULTS CONSOLE HIV ANTIBODY (ROUTINE TESTING): HIV: NONREACTIVE

## 2018-08-05 LAB — OB RESULTS CONSOLE RPR: RPR: NONREACTIVE

## 2018-08-31 ENCOUNTER — Inpatient Hospital Stay (HOSPITAL_COMMUNITY)
Admission: AD | Admit: 2018-08-31 | Discharge: 2018-09-01 | DRG: 807 | Disposition: A | Discharge status: Home / Self Care | Payer: Medicaid Other | Attending: Obstetrics & Gynecology | Admitting: Obstetrics & Gynecology

## 2018-08-31 ENCOUNTER — Encounter (HOSPITAL_COMMUNITY): Payer: Self-pay | Admitting: *Deleted

## 2018-08-31 DIAGNOSIS — Z3A34 34 weeks gestation of pregnancy: Secondary | ICD-10-CM | POA: Diagnosis not present

## 2018-08-31 DIAGNOSIS — R87612 Low grade squamous intraepithelial lesion on cytologic smear of cervix (LGSIL): Secondary | ICD-10-CM | POA: Diagnosis present

## 2018-08-31 DIAGNOSIS — O4703 False labor before 37 completed weeks of gestation, third trimester: Secondary | ICD-10-CM

## 2018-08-31 DIAGNOSIS — O3433 Maternal care for cervical incompetence, third trimester: Secondary | ICD-10-CM | POA: Diagnosis present

## 2018-08-31 DIAGNOSIS — O3430 Maternal care for cervical incompetence, unspecified trimester: Secondary | ICD-10-CM

## 2018-08-31 LAB — URINALYSIS, ROUTINE W REFLEX MICROSCOPIC
Bacteria, UA: NONE SEEN
Bilirubin Urine: NEGATIVE
GLUCOSE, UA: NEGATIVE mg/dL
Ketones, ur: NEGATIVE mg/dL
Leukocytes,Ua: NEGATIVE
Nitrite: NEGATIVE
PH: 9 — AB (ref 5.0–8.0)
Protein, ur: NEGATIVE mg/dL
SPECIFIC GRAVITY, URINE: 1.012 (ref 1.005–1.030)

## 2018-08-31 LAB — CBC
HCT: 34 % — ABNORMAL LOW (ref 36.0–46.0)
Hemoglobin: 10.9 g/dL — ABNORMAL LOW (ref 12.0–15.0)
MCH: 27.9 pg (ref 26.0–34.0)
MCHC: 32.1 g/dL (ref 30.0–36.0)
MCV: 87.2 fL (ref 80.0–100.0)
PLATELETS: 246 10*3/uL (ref 150–400)
RBC: 3.9 MIL/uL (ref 3.87–5.11)
RDW: 13.6 % (ref 11.5–15.5)
WBC: 9.9 10*3/uL (ref 4.0–10.5)
nRBC: 0 % (ref 0.0–0.2)

## 2018-08-31 LAB — TYPE AND SCREEN
ABO/RH(D): B POS
Antibody Screen: NEGATIVE

## 2018-08-31 MED ORDER — LACTATED RINGERS IV SOLN: 500.0000 mL | INTRAVENOUS | Status: DC | PRN

## 2018-08-31 MED ORDER — MAGNESIUM SULFATE BOLUS VIA INFUSION
5.0000 g | Freq: Once | INTRAVENOUS | Status: AC
  Administered 2018-08-31: 5 g via INTRAVENOUS
  Filled 2018-08-31: qty 500

## 2018-08-31 MED ORDER — OXYCODONE-ACETAMINOPHEN 5-325 MG PO TABS: 2.0000 | ORAL_TABLET | ORAL | Status: DC | PRN

## 2018-08-31 MED ORDER — LACTATED RINGERS IV SOLN
INTRAVENOUS | Status: DC
  Administered 2018-08-31: 17:00:00 via INTRAVENOUS

## 2018-08-31 MED ORDER — DIPHENHYDRAMINE HCL 25 MG PO CAPS: 25.0000 mg | ORAL_CAPSULE | Freq: Four times a day (QID) | ORAL | Status: DC | PRN

## 2018-08-31 MED ORDER — ZOLPIDEM TARTRATE 5 MG PO TABS: 5.0000 mg | ORAL_TABLET | Freq: Every evening | ORAL | Status: DC | PRN

## 2018-08-31 MED ORDER — COCONUT OIL OIL: 1.0000 "application " | TOPICAL_OIL | Status: DC | PRN

## 2018-08-31 MED ORDER — SENNOSIDES-DOCUSATE SODIUM 8.6-50 MG PO TABS
2.0000 | ORAL_TABLET | ORAL | Status: DC
  Administered 2018-09-01: 2 via ORAL
  Filled 2018-08-31: qty 2

## 2018-08-31 MED ORDER — FENTANYL CITRATE (PF) 100 MCG/2ML IJ SOLN: 100.0000 ug | INTRAMUSCULAR | Status: DC | PRN

## 2018-08-31 MED ORDER — NIFEDIPINE 10 MG PO CAPS
10.0000 mg | ORAL_CAPSULE | ORAL | Status: AC | PRN
  Administered 2018-08-31 (×3): 10 mg via ORAL
  Filled 2018-08-31 (×3): qty 1

## 2018-08-31 MED ORDER — BETAMETHASONE SOD PHOS & ACET 6 (3-3) MG/ML IJ SUSP
12.0000 mg | Freq: Once | INTRAMUSCULAR | Status: AC
  Administered 2018-08-31: 12 mg via INTRAMUSCULAR
  Filled 2018-08-31: qty 2

## 2018-08-31 MED ORDER — ONDANSETRON HCL 4 MG PO TABS: 4.0000 mg | ORAL_TABLET | ORAL | Status: DC | PRN

## 2018-08-31 MED ORDER — OXYTOCIN BOLUS FROM INFUSION
500.0000 mL | Freq: Once | INTRAVENOUS | Status: AC
  Administered 2018-08-31: 500 mL via INTRAVENOUS

## 2018-08-31 MED ORDER — BENZOCAINE-MENTHOL 20-0.5 % EX AERO: 1.0000 "application " | INHALATION_SPRAY | CUTANEOUS | Status: DC | PRN

## 2018-08-31 MED ORDER — IBUPROFEN 600 MG PO TABS
600.0000 mg | ORAL_TABLET | Freq: Four times a day (QID) | ORAL | Status: DC
  Administered 2018-09-01 (×2): 600 mg via ORAL
  Filled 2018-08-31 (×2): qty 1

## 2018-08-31 MED ORDER — DIBUCAINE 1 % RE OINT: 1.0000 "application " | TOPICAL_OINTMENT | RECTAL | Status: DC | PRN

## 2018-08-31 MED ORDER — ONDANSETRON HCL 4 MG/2ML IJ SOLN: 4.0000 mg | INTRAMUSCULAR | Status: DC | PRN

## 2018-08-31 MED ORDER — LACTATED RINGERS IV BOLUS
1000.0000 mL | Freq: Once | INTRAVENOUS | Status: AC
  Administered 2018-08-31: 1000 mL via INTRAVENOUS

## 2018-08-31 MED ORDER — MEASLES, MUMPS & RUBELLA VAC IJ SOLR: 0.5000 mL | Freq: Once | INTRAMUSCULAR | Status: DC

## 2018-08-31 MED ORDER — ACETAMINOPHEN 325 MG PO TABS: 650.0000 mg | ORAL_TABLET | ORAL | Status: DC | PRN

## 2018-08-31 MED ORDER — MAGNESIUM SULFATE 40 G IN LACTATED RINGERS - SIMPLE
3.0000 g/h | INTRAVENOUS | Status: DC
  Filled 2018-08-31: qty 500

## 2018-08-31 MED ORDER — SOD CITRATE-CITRIC ACID 500-334 MG/5ML PO SOLN: 30.0000 mL | ORAL | Status: DC | PRN

## 2018-08-31 MED ORDER — BETAMETHASONE SOD PHOS & ACET 6 (3-3) MG/ML IJ SUSP: 12.0000 mg | Freq: Once | INTRAMUSCULAR | Status: DC

## 2018-08-31 MED ORDER — PENICILLIN G 3 MILLION UNITS IVPB - SIMPLE MED: 3.0000 10*6.[IU] | INTRAVENOUS | Status: DC

## 2018-08-31 MED ORDER — OXYTOCIN 40 UNITS IN NORMAL SALINE INFUSION - SIMPLE MED
2.5000 [IU]/h | INTRAVENOUS | Status: DC
  Filled 2018-08-31: qty 1000

## 2018-08-31 MED ORDER — OXYCODONE-ACETAMINOPHEN 5-325 MG PO TABS: 1.0000 | ORAL_TABLET | ORAL | Status: DC | PRN

## 2018-08-31 MED ORDER — ONDANSETRON HCL 4 MG/2ML IJ SOLN: 4.0000 mg | Freq: Four times a day (QID) | INTRAMUSCULAR | Status: DC | PRN

## 2018-08-31 MED ORDER — LIDOCAINE HCL (PF) 1 % IJ SOLN: 30.0000 mL | INTRAMUSCULAR | Status: DC | PRN

## 2018-08-31 MED ORDER — TETANUS-DIPHTH-ACELL PERTUSSIS 5-2.5-18.5 LF-MCG/0.5 IM SUSP: 0.5000 mL | Freq: Once | INTRAMUSCULAR | Status: DC

## 2018-08-31 MED ORDER — WITCH HAZEL-GLYCERIN EX PADS: 1.0000 "application " | MEDICATED_PAD | CUTANEOUS | Status: DC | PRN

## 2018-08-31 MED ORDER — FLEET ENEMA 7-19 GM/118ML RE ENEM: 1.0000 | ENEMA | RECTAL | Status: DC | PRN

## 2018-08-31 MED ORDER — PRENATAL MULTIVITAMIN CH
1.0000 | ORAL_TABLET | Freq: Every day | ORAL | Status: DC
  Administered 2018-09-01: 1 via ORAL
  Filled 2018-08-31: qty 1

## 2018-08-31 MED ORDER — SIMETHICONE 80 MG PO CHEW: 80.0000 mg | CHEWABLE_TABLET | ORAL | Status: DC | PRN

## 2018-08-31 MED ORDER — SODIUM CHLORIDE 0.9 % IV SOLN: 5.0000 10*6.[IU] | Freq: Once | INTRAVENOUS | Status: DC

## 2018-08-31 NOTE — MAU Provider Note (Signed)
Chief Complaint:  Contractions and Vaginal Bleeding   First Provider Initiated Contact with Patient 08/31/18 1535     HPI: Summer Wilkinson is a 33 y.o. [id number] at [redacted]w[redacted]d who presents to maternity admissions reporting contractions & vaginal bleeding. Symptoms began this morning. Reports painful contractions every 3-5 minutes. Noticed bloody discharge when she wiped. No recent abdominal trauma or falls. Normal fetal movement. Goes to [facility] for prenatal care. No LOF.   Location: abdomen Quality: contractions Severity: 6/10 in pain scale Duration: 3 Timing: every 3-5 minutes Modifying factors: none Associated signs and symptoms: vaginal bleeding  Pregnancy Course: PNC at [facility]. Dating by 16 wk ultrasound (unsure LMP).  Past Medical History:  Diagnosis Date  . Gallbladder sludge    OB History  Gravida Para Term Preterm AB Living  5 3 3  0 1 3  SAB TAB Ectopic Multiple Live Births  1 0 0   2    # Outcome Date GA Lbr Len/2nd Weight Sex Delivery Anes PTL Lv  5 Current           4 Term 12/02/16 [redacted]w[redacted]d 01:44 4150 g M Vag-Spont None  LIV  3 SAB 2016          2 Term 02/20/11 [redacted]w[redacted]d 06:37 / 00:04 3521 g F Vag-Spont None  LIV     Birth Comments: wnl  1 Term 12/24/04     Vag-Spont      Past Surgical History:  Procedure Laterality Date  . bacterial vaginosis  2012  . chlamydia  2006  . NO PAST SURGERIES     Family History  Problem Relation Age of Onset  . Diabetes Sister   . Colon cancer Neg Hx    Social History   Tobacco Use  . Smoking status: Never Smoker  . Smokeless tobacco: Never Used  Substance Use Topics  . 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 Drug use: No   Allergies  Allergen Reactions  . Peanut-Containing Drug Products Other (See Comments)    unknown   Medications Prior to Admission  Medication Sig Dispense Refill Last Dose  . Prenatal Multivit-Min-Fe-FA (PRENATAL VITAMINS PO) Take by mouth.       I have reviewed  patient's Past Medical Hx, Surgical Hx, Family Hx, Social Hx, medications and allergies.   ROS:  Review of Systems  Constitutional: Negative.   Gastrointestinal: Positive for abdominal pain.  Genitourinary: Positive for vaginal bleeding.    Physical Exam   Patient Vitals for the past 24 hrs:  BP Temp Pulse Resp SpO2  08/31/18 1648 136/69 - 94 - -  08/31/18 1531 121/71 (!) 97.4 F (36.3 C) 83 17 100 %  08/31/18 1514 (!) 109/53 (!) 97.4 F (36.3 C) 83 - 100 %    Constitutional: Well-developed, well-nourished female in no acute distress.  Cardiovascular: normal rate & rhythm, no murmur Respiratory: normal effort, lung sounds clear throughout GI: Abd soft, non-tender, gravid appropriate for gestational age. Pos BS x 4 MS: Extremities nontender, no edema, normal ROM Neurologic: Alert and oriented x 4.  GU:    Small amount of dark red bloody show  Dilation: 3.5 Effacement (%): 90 Cervical Position: Middle Station: -3 Presentation: Vertex Exam by:: Judeth Horn NP  NST:  Baseline: 145 bpm, Variability: Good {> 6 bpm), Accelerations: Reactive and Decelerations: Absent   Labs: Results for orders placed or performed during the hospital encounter of 08/31/18 (from the past 24 hour(s))  Urinalysis, Routine w reflex microscopic     Status: Abnormal   Collection Time: 08/31/18  3:40 PM  Result Value Ref Range   Color, Urine YELLOW YELLOW   APPearance CLEAR CLEAR   Specific Gravity, Urine 1.012 1.005 - 1.030   pH 9.0 (H) 5.0 - 8.0   Glucose, UA NEGATIVE NEGATIVE mg/dL   Hgb urine dipstick LARGE (A) NEGATIVE   Bilirubin Urine NEGATIVE NEGATIVE   Ketones, ur NEGATIVE NEGATIVE mg/dL   Protein, ur NEGATIVE NEGATIVE mg/dL   Nitrite NEGATIVE NEGATIVE   Leukocytes,Ua NEGATIVE NEGATIVE   RBC / HPF >50 (H) 0 - 5 RBC/hpf   WBC, UA 0-5 0 - 5 WBC/hpf   Bacteria, UA NONE SEEN NONE SEEN   Squamous Epithelial / LPF 0-5 0 - 5   Mucus PRESENT     Imaging:  No results found.  MAU  Course: Orders Placed This Encounter  Procedures  . Culture, beta strep (group b only)  . Urinalysis, Routine w reflex microscopic  . CBC  . RPR  . Order Rapid HIV per protocol if no results on chart  . Type and screen [facility]   Meds ordered this encounter  Medications  . lactated ringers bolus 1,000 mL  . NIFEdipine (PROCARDIA) capsule 10 mg  . betamethasone acetate-betamethasone sodium phosphate (CELESTONE) injection 12 mg    MDM: Ctx every 2-3 minutes. Pt very uncomfortable. BMZ given. IV fluid bolus & procardia given without improvement in symptoms. Cervical change after 1 hour of monitoring & pt increasingly uncomfortable.   C/w Dr. Despina Hidden. Will admit to birthing suites. Mag sulfate 5 gm bolus followed by 3 gm per hour.  Dr. Leary Roca (neonatologist) notified of patient's admission d/t preterm status.   Assessment: 1. Premature dilatation of cervix during pregnancy   2. Preterm uterine contractions in third trimester, antepartum   3. [redacted] weeks gestation of pregnancy     Plan: Admit to birthing suites GBS culture pending- Penicillin ordered for GBS prophylaxis with unknown status Mag sulfate 5/3 per Dr. Despina Hidden Report given to labor team  NICU aware of patient  Judeth Horn, NP 08/31/2018 4:54 PM

## 2018-08-31 NOTE — H&P (Signed)
OBSTETRIC ADMISSION HISTORY AND PHYSICAL  *Late entry, patient arrived to L&D and delivered soon after arrival. Admission H&P filed after delivery* Summer Wilkinson is a 33 y.o. female [phone] with IUP at [redacted]w[redacted]d by L/16 presenting for preterm labor. Has been in MAU for evaluation and received tocolysis with Mg++, nifedipine and fluids but is in active labor.   Reports fetal movement. Denies vaginal bleeding, leakage of fluids.  She received her prenatal care at [facility].  Support person in labor: Husband   Ultrasounds . 16w2: viability U/S . 21w5: normal anatomy U/S with some limited spine views . 25w6: normal interval growth, completion of anatomy scan wnl  Prenatal History/Complications: . LGSIL/HPV(+) . BV in pregnancy   Past Medical History: Past Medical History:  Diagnosis Date  . Gallbladder sludge     Past Surgical History: Past Surgical History:  Procedure Laterality Date  . bacterial vaginosis  2012  . chlamydia  2006  . NO PAST SURGERIES      Obstetrical History: OB History    Gravida  5   Para  3   Term  3   Preterm  0   AB  1   Living  3     SAB  1   TAB  0   Ectopic  0   Multiple      Live Births  2           Social History: Social History   Socioeconomic History  . Marital status: Single    Spouse name: Not on file  . Number of children: Not on file  . Years of education: Not on file  . Highest education level: Not on file  Occupational History  . Occupation: [employer]  Social Needs  . Financial resource strain: Not on file  . Food insecurity:    Worry: Not on file    Inability: Not on file  . Transportation needs:    Medical: Not on file    Non-medical: Not on file  Tobacco Use  . Smoking status: Never Smoker  . Smokeless tobacco: Never Used  Substance and Sexual Activity  . 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 Drug use: No  . Sexual activity: Yes    Birth  control/protection: None  Lifestyle  . Physical activity:    Days per week: Not on file    Minutes per session: Not on file  . Stress: Not on file  Relationships  . Social connections:    Talks on phone: Not on file    Gets together: Not on file    Attends religious service: Not on file    Active member of club or organization: Not on file    Attends meetings of clubs or organizations: Not on file    Relationship status: Not on file  Other Topics Concern  . Not on file  Social History Narrative   ** Merged History Encounter **        Family History: Family History  Problem Relation Age of Onset  . Diabetes Sister   . Colon cancer Neg Hx     Allergies: Allergies  Allergen Reactions  . Peanut-Containing Drug Products Other (See Comments)    unknown    Medications Prior to Admission  Medication Sig Dispense Refill Last Dose  . Prenatal Multivit-Min-Fe-FA (PRENATAL VITAMINS PO) Take by mouth.        Review of Systems  All systems reviewed  and negative except as stated in HPI  Blood pressure 136/69, pulse 94, temperature 97.8 F (36.6 C), temperature source Oral, resp. rate 16, last menstrual period 01/05/2018, SpO2 100 %, unknown if currently breastfeeding. General appearance: uncomfortable appearing Lungs: no respiratory distress Heart: regular rate  Abdomen: soft, non-tender; gravid  Pelvic: deferred Extremities: no LE edema Presentation: cephalic Fetal monitoring: 140s/mod/+a/-d Uterine activity: every 1-3 minutes Dilation: 3.5 Effacement (%): 90 Station: -3 Exam by:: Judeth Horn NP  Prenatal labs: ABO, Rh: --/--/B POS (03/15 1701) Antibody: NEG (03/15 1701) Rubella: Immune (11/18 0000) RPR: Nonreactive (02/18 0000)  HBsAg: Negative (11/18 0000)  HIV: Non-reactive (02/18 0000)  GBS:   unknown Glucola: abnormal 1-hr, normal 3-hr Genetic screening:  Not available   Prenatal Transfer Tool  Maternal Diabetes: No Genetic Screening: not  available Maternal Ultrasounds/Referrals: Normal Fetal Ultrasounds or other Referrals:  None Maternal Substance Abuse:  No Significant Maternal Medications:  PNV  metronidazole  Significant Maternal Lab Results: None  Results for orders placed or performed during the hospital encounter of 08/31/18 (from the past 24 hour(s))  Urinalysis, Routine w reflex microscopic   Collection Time: 08/31/18  3:40 PM  Result Value Ref Range   Color, Urine YELLOW YELLOW   APPearance CLEAR CLEAR   Specific Gravity, Urine 1.012 1.005 - 1.030   pH 9.0 (H) 5.0 - 8.0   Glucose, UA NEGATIVE NEGATIVE mg/dL   Hgb urine dipstick LARGE (A) NEGATIVE   Bilirubin Urine NEGATIVE NEGATIVE   Ketones, ur NEGATIVE NEGATIVE mg/dL   Protein, ur NEGATIVE NEGATIVE mg/dL   Nitrite NEGATIVE NEGATIVE   Leukocytes,Ua NEGATIVE NEGATIVE   RBC / HPF >50 (H) 0 - 5 RBC/hpf   WBC, UA 0-5 0 - 5 WBC/hpf   Bacteria, UA NONE SEEN NONE SEEN   Squamous Epithelial / LPF 0-5 0 - 5   Mucus PRESENT   CBC   Collection Time: 08/31/18  5:01 PM  Result Value Ref Range   WBC 9.9 4.0 - 10.5 K/uL   RBC 3.90 3.87 - 5.11 MIL/uL   Hemoglobin 10.9 (L) 12.0 - 15.0 g/dL   HCT 43.2 (L) 76.1 - 47.0 %   MCV 87.2 80.0 - 100.0 fL   MCH 27.9 26.0 - 34.0 pg   MCHC 32.1 30.0 - 36.0 g/dL   RDW 92.9 57.4 - 73.4 %   Platelets 246 150 - 400 K/uL   nRBC 0.0 0.0 - 0.2 %  Type and screen [facility]   Collection Time: 08/31/18  5:01 PM  Result Value Ref Range   ABO/RH(D) B POS    Antibody Screen NEG    Sample Expiration      09/03/2018 Performed at [facility], [address], [place], [place] [address]     Patient Active Problem List   Diagnosis Date Noted  . Premature cervical dilation, third trimester 08/31/2018  . Low grade squamous intraepith lesion on cytologic smear cervix (lgsil) 08/31/2018  . Preterm delivery 08/31/2018  . Normal labor 12/02/2016    Assessment/Plan:  Summer Wilkinson is a 33 y.o. [id number]  at [redacted]w[redacted]d here for preterm labor.  Labor: Expectant management, active and uncomfortable on arrival to L&D. Anticipate SVD soon.  -- pain control: nothing  Fetal Wellbeing: EFW 4-5lbs by Leopold's. Cephalic by sutures.  -- GBS (unknown) -- continuous fetal monitoring - category I   Postpartum Planning -- breast/BTL (no insurance) -- RI/[x] Tdap   Jacquez Sheetz S. Earlene Plater, DO OB/GYN Fellow

## 2018-08-31 NOTE — MAU Note (Signed)
Pt states she started having painful contractions about 3 hours ago and started having bleeding that is almost as much as a period 30-40 minutes ago.  Denies LOF, reports some fetal movement today.  Denies complications with pregnancy.

## 2018-08-31 NOTE — MAU Note (Signed)
Pt states CTX started 2-3 hrs ago off and on, bloody show started 20-30 min ago

## 2018-09-01 ENCOUNTER — Ambulatory Visit: Payer: Self-pay

## 2018-09-01 ENCOUNTER — Encounter (HOSPITAL_COMMUNITY): Payer: Self-pay | Admitting: *Deleted

## 2018-09-01 LAB — CULTURE, BETA STREP (GROUP B ONLY)

## 2018-09-01 LAB — ABO/RH: ABO/RH(D): B POS

## 2018-09-01 MED ORDER — IBUPROFEN 600 MG PO TABS: 600.0000 mg | ORAL_TABLET | Freq: Four times a day (QID) | ORAL | 0 refills | Status: AC

## 2018-09-01 MED ORDER — SENNOSIDES-DOCUSATE SODIUM 8.6-50 MG PO TABS: 2.0000 | ORAL_TABLET | ORAL | 0 refills | Status: DC

## 2018-09-01 NOTE — Lactation Note (Signed)
This note was copied from a baby's chart. Lactation Consultation Note  Patient Name: Boy Sanoe Fron [id number] Date: 09/01/2018  attempt to see times two  Will f/u with mom later today   Maternal Data    Feeding Feeding Type: Breast Milk  LATCH Score                   Interventions    Lactation Tools Discussed/Used     Consult Status      Makari Portman Michaelle Copas 09/01/2018, 1:38 PM

## 2018-09-01 NOTE — Lactation Note (Addendum)
This note was copied from a baby's chart. Lactation Consultation Note Baby in NICU, mom resting, woke when LC came into rm.  Mom very sleepy. States she BF her other 3 children 66,7, 1 1/31 yrs old for 1 yr each. RN set up DEBP. Mom has hand expressed colostrum 3 colostrum containers full, took to NICU. Encouraged mom to pump every 3 hrs as if BF baby.  Asked mom if she wanted [place] or [place] brochure, stated English was fine. Encouraged mom to call for assistance in latching or questions when mom is in the NICU.  Patient Name: Boy Lilika Trimnal [id number] Date: 09/01/2018 Reason for consult: Initial assessment;NICU baby;Late-preterm 34-36.6wks   Maternal Data Has patient been taught Hand Expression?: Yes Does the patient have breastfeeding experience prior to this delivery?: Yes  Feeding    LATCH Score                   Interventions    Lactation Tools Discussed/Used Pump Review: Setup, frequency, and cleaning;Milk Storage(RN) Initiated by:: RN Date initiated:: 08/31/18   Consult Status Consult Status: Follow-up Date: 09/01/18 Follow-up type: In-patient    Charyl Dancer 09/01/2018, 3:26 AM

## 2018-09-01 NOTE — Discharge Instructions (Signed)
Vaginal Delivery, Care After °Refer to this sheet in the next few weeks. These instructions provide you with information about caring for yourself after vaginal delivery. Your health care provider may also give you more specific instructions. Your treatment has been planned according to current medical practices, but problems sometimes occur. Call your health care provider if you have any problems or questions. °What can I expect after the procedure? °After vaginal delivery, it is common to have: °· Some bleeding from your vagina. °· Soreness in your abdomen, your vagina, and the area of skin between your vaginal opening and your anus (perineum). °· Pelvic cramps. °· Fatigue. °Follow these instructions at home: °Medicines °· Take over-the-counter and prescription medicines only as told by your health care provider. °· If you were prescribed an antibiotic medicine, take it as told by your health care provider. Do not stop taking the antibiotic until it is finished. °Driving ° °· Do not drive or operate heavy machinery while taking prescription pain medicine. °· Do not drive for 24 hours if you received a sedative. °Lifestyle °· Do not drink alcohol. This is especially important if you are breastfeeding or taking medicine to relieve pain. °· Do not use tobacco products, including cigarettes, chewing tobacco, or e-cigarettes. If you need help quitting, ask your health care provider. °Eating and drinking °· Drink at least 8 eight-ounce glasses of water every day unless you are told not to by your health care provider. If you choose to breastfeed your baby, you may need to drink more water than this. °· Eat high-fiber foods every day. These foods may help prevent or relieve constipation. High-fiber foods include: °? Whole grain cereals and breads. °? Brown rice. °? Beans. °? Fresh fruits and vegetables. °Activity °· Return to your normal activities as told by your health care provider. Ask your health care provider what  activities are safe for you. °· Rest as much as possible. Try to rest or take a nap when your baby is sleeping. °· Do not lift anything that is heavier than your baby or 10 lb (4.5 kg) until your health care provider says that it is safe. °· Talk with your health care provider about when you can engage in sexual activity. This may depend on your: °? Risk of infection. °? Rate of healing. °? Comfort and desire to engage in sexual activity. °Vaginal Care °· If you have an episiotomy or a vaginal tear, check the area every day for signs of infection. Check for: °? More redness, swelling, or pain. °? More fluid or blood. °? Warmth. °? Pus or a bad smell. °· Do not use tampons or douches until your health care provider says this is safe. °· Watch for any blood clots that may pass from your vagina. These may look like clumps of dark red, brown, or black discharge. °General instructions °· Keep your perineum clean and dry as told by your health care provider. °· Wear loose, comfortable clothing. °· Wipe from front to back when you use the toilet. °· Ask your health care provider if you can shower or take a bath. If you had an episiotomy or a perineal tear during labor and delivery, your health care provider may tell you not to take baths for a certain length of time. °· Wear a bra that supports your breasts and fits you well. °· If possible, have someone help you with household activities and help care for your baby for at least a few days after you   leave the hospital. °· Keep all follow-up visits for you and your baby as told by your health care provider. This is important. °Contact a health care provider if: °· You have: °? Vaginal discharge that has a bad smell. °? Difficulty urinating. °? Pain when urinating. °? A sudden increase or decrease in the frequency of your bowel movements. °? More redness, swelling, or pain around your episiotomy or vaginal tear. °? More fluid or blood coming from your episiotomy or vaginal  tear. °? Pus or a bad smell coming from your episiotomy or vaginal tear. °? A fever. °? A rash. °? Little or no interest in activities you used to enjoy. °? Questions about caring for yourself or your baby. °· Your episiotomy or vaginal tear feels warm to the touch. °· Your episiotomy or vaginal tear is separating or does not appear to be healing. °· Your breasts are painful, hard, or turn red. °· You feel unusually sad or worried. °· You feel nauseous or you vomit. °· You pass large blood clots from your vagina. If you pass a blood clot from your vagina, save it to show to your health care provider. Do not flush blood clots down the toilet without having your health care provider look at them. °· You urinate more than usual. °· You are dizzy or light-headed. °· You have not breastfed at all and you have not had a menstrual period for 12 weeks after delivery. °· You have stopped breastfeeding and you have not had a menstrual period for 12 weeks after you stopped breastfeeding. °Get help right away if: °· You have: °? Pain that does not go away or does not get better with medicine. °? Chest pain. °? Difficulty breathing. °? Blurred vision or spots in your vision. °? Thoughts about hurting yourself or your baby. °· You develop pain in your abdomen or in one of your legs. °· You develop a severe headache. °· You faint. °· You bleed from your vagina so much that you fill two sanitary pads in one hour. °This information is not intended to replace advice given to you by your health care provider. Make sure you discuss any questions you have with your health care provider. °Document Released: 06/01/2000 Document Revised: 11/16/2015 Document Reviewed: 06/19/2015 °Elsevier Interactive Patient Education © 2019 [employer] ° °

## 2018-09-01 NOTE — Discharge Summary (Signed)
Postpartum Discharge Summary     Patient Name: Summer Wilkinson DOB: Mar 28, 1986 MRN: [id number]  Date of admission: 08/31/2018 Delivering Provider: Tamera Stands   Date of discharge: 09/01/2018  Admitting diagnosis: 35 wks preg,CTX,bleeding Intrauterine pregnancy: [redacted]w[redacted]d     Secondary diagnosis:  Active Problems:   Premature cervical dilation, third trimester   Low grade squamous intraepith lesion on cytologic smear cervix (lgsil)   Preterm delivery  Additional problems: None     Discharge diagnosis: Preterm Pregnancy Delivered                                                                                                Post partum procedures:None  Augmentation: None  Complications: None  Hospital course:  Onset of Labor With Vaginal Delivery     33 y.o. yo [id number] at [redacted]w[redacted]d was admitted in Active Labor on 08/31/2018. Patient had an uncomplicated labor course as follows:  Membrane Rupture Time/Date: 5:28 PM ,08/31/2018   Intrapartum Procedures: Episiotomy: None [1]                                         Lacerations:  None [1]  Patient had a delivery of a Viable infant. 08/31/2018  Information for the patient's newborn:  Alaja, Kanis [[id number]]  Delivery Method: Vaginal, Spontaneous(Filed from Delivery Summary)    Pateint had an uncomplicated postpartum course.  She is ambulating, tolerating a regular diet, passing flatus, and urinating well. Patient is discharged home in stable condition on 09/01/18.   Magnesium Sulfate recieved: Yes BMZ received: Yes  Physical exam  Vitals:   08/31/18 2000 08/31/18 2113 09/01/18 0030 09/01/18 0440  BP: 120/71 117/64 109/68 119/77  Pulse: 91 86 70 72  Resp: 18 18 16 16   Temp: 98.8 F (37.1 C) 99.2 F (37.3 C) 98.6 F (37 C) (!) 97.5 F (36.4 C)  TempSrc: Oral Oral Oral Axillary  SpO2:       General: alert, cooperative and no distress Lochia: appropriate Uterine Fundus: firm Incision: N/A DVT Evaluation: No  evidence of DVT seen on physical exam. No cords or calf tenderness. Labs: Lab Results  Component Value Date   WBC 9.9 08/31/2018   HGB 10.9 (L) 08/31/2018   HCT 34.0 (L) 08/31/2018   MCV 87.2 08/31/2018   PLT 246 08/31/2018   CMP Latest Ref Rng & Units 04/24/2018  Glucose 70 - 99 mg/dL 87  BUN 6 - 20 mg/dL 10  Creatinine 6.28 - 3.66 mg/dL 2.94(T)  Sodium 654 - 650 mmol/L 134(L)  Potassium 3.5 - 5.1 mmol/L 3.3(L)  Chloride 98 - 111 mmol/L 104  CO2 22 - 32 mmol/L 21(L)  Calcium 8.9 - 10.3 mg/dL 3.5(W)  Total Protein 6.5 - 8.1 g/dL 6.3(L)  Total Bilirubin 0.3 - 1.2 mg/dL 0.6  Alkaline Phos 38 - 126 U/L 45  AST 15 - 41 U/L 16  ALT 0 - 44 U/L 16    Discharge instruction: per After Visit Summary and "Baby and Me Booklet".  After visit meds:  Allergies as of 09/01/2018      Reactions   Peanut-containing Drug Products Other (See Comments)   unknown      Medication List    TAKE these medications   ibuprofen 600 MG tablet Commonly known as:  ADVIL,MOTRIN Take 1 tablet (600 mg total) by mouth every 6 (six) hours.   PRENATAL VITAMINS PO Take by mouth.   senna-docusate 8.6-50 MG tablet Commonly known as:  Senokot-S Take 2 tablets by mouth daily. Start taking on:  September 02, 2018       Diet: low salt diet  Activity: Advance as tolerated. Pelvic rest for 6 weeks.   Outpatient follow up:4 weeks Follow up Appt:No future appointments. Follow up Visit: Follow-up Information    Department, [facility] Schedule an appointment as soon as possible for a visit in 6 week(s).   Why:  for postpartum visit Contact information: [address] [place] [address] [phone]           Please schedule this patient for Postpartum visit in: 4 weeks with the following provider: Any provider For C/S patients schedule nurse incision check in weeks 2 weeks: no Low risk pregnancy complicated by: Preterm labor and delivery, LGSIL s/p Colpo in 06/2018 Delivery  mode:  SVD Anticipated Birth Control:  Plans Interval BTL. Currently no insurance. POP's until able to get BTL. Please continue to discuss birth control options. PP Procedures needed: None  Schedule Integrated BH visit: no   Newborn Data: Live born female  Birth Weight: 5 lb 4.3 oz (2390 g) APGAR: 8, 9  Newborn Delivery   Birth date/time:  08/31/2018 17:30:00 Delivery type:  Vaginal, Spontaneous     Baby Feeding: Bottle and Breast Disposition:NICU   09/01/2018 Joana Reamer, DO

## 2018-09-02 LAB — RPR: RPR Ser Ql: NONREACTIVE

## 2018-11-21 ENCOUNTER — Encounter (HOSPITAL_COMMUNITY): Payer: Self-pay | Admitting: *Deleted

## 2018-12-11 ENCOUNTER — Ambulatory Visit (HOSPITAL_COMMUNITY)
Admission: RE | Admit: 2018-12-11 | Discharge: 2018-12-11 | Disposition: A | Discharge status: Home / Self Care | Payer: Self-pay | Source: Ambulatory Visit | Attending: Obstetrics and Gynecology | Admitting: Obstetrics and Gynecology

## 2018-12-11 ENCOUNTER — Other Ambulatory Visit: Payer: Self-pay

## 2018-12-11 ENCOUNTER — Encounter (HOSPITAL_COMMUNITY): Payer: Self-pay

## 2018-12-11 DIAGNOSIS — Z1239 Encounter for other screening for malignant neoplasm of breast: Secondary | ICD-10-CM | POA: Insufficient documentation

## 2018-12-11 DIAGNOSIS — R87612 Low grade squamous intraepithelial lesion on cytologic smear of cervix (LGSIL): Secondary | ICD-10-CM | POA: Insufficient documentation

## 2018-12-11 NOTE — Progress Notes (Signed)
Patient referred to [place] by the [facility] due to recommending a colposcopy to follow-up for her abnormal Pap smear on 10/21/2018.  Pap Smear: Pap smear not completed today. Last Pap smear was 10/21/2018 at the [facility] and LSIL with positive HPV. Referred patient to the [facility] for [place] for a colposcopy to follow-up for her abnormal Pap smear. Appointment scheduled for Thursday, December 18, 2018 at 1355. Per patient her most recent Pap smear is the only abnormal Pap smear she has had. Last  Pap smear result is in Epic.  Physical exam: Breasts Breasts symmetrical. No skin abnormalities bilateral breasts. No nipple retraction bilateral breasts. Patient is currently breastfeeding. No lymphadenopathy. No lumps palpated bilateral breasts. No complaints of pain or tenderness on exam. Screening mammogram recommended at age 75 unless clinically indicated prior.  Pelvic/Bimanual No Pap smear completed today since last Pap smear and HPV typing was 10/21/2018. Pap smear not indicated per BCCCP guidelines.   Smoking History: Patient has never smoked.  Patient Navigation: Patient education provided. Access to services provided for patient through BCCCP program.   Breast and Cervical Cancer Risk Assessment: Patient has no family history of breast cancer, known genetic mutations, or radiation treatment to the chest before age 79. Patient has no history of cervical dysplasia, immunocompromised, or DES exposure in-utero. Breast cancer risk assessment completed. No breast cancer risk calculated due to patient is less than 65 years old.

## 2018-12-11 NOTE — Patient Instructions (Signed)
Explained breast self awareness with Summer Wilkinson. Patient did not need a Pap smear today due to last Pap smear was 10/21/2018. Explained the colposcopy the recommended follow-up for her abnormal Pap smear. Referred patient to the [facility] [place] for a colposcopy to follow-up for her abnormal Pap smear. Appointment scheduled for Thursday, December 18, 2018 at 1355. Patient aware of appointment and will be there. Let patient know a screening mammogram is recommended at age 89 unless clinically indicated prior. Summer Wilkinson verbalized understanding.  Summer Wilkinson, Arvil Chaco, RN 1:59 PM

## 2018-12-12 ENCOUNTER — Encounter (HOSPITAL_COMMUNITY): Payer: Self-pay | Admitting: *Deleted

## 2018-12-18 ENCOUNTER — Encounter: Payer: Self-pay | Admitting: Obstetrics and Gynecology

## 2018-12-18 ENCOUNTER — Ambulatory Visit (INDEPENDENT_AMBULATORY_CARE_PROVIDER_SITE_OTHER): Payer: Self-pay | Admitting: Obstetrics and Gynecology

## 2018-12-18 ENCOUNTER — Other Ambulatory Visit: Payer: Self-pay

## 2018-12-18 ENCOUNTER — Other Ambulatory Visit (HOSPITAL_COMMUNITY)
Admission: RE | Admit: 2018-12-18 | Discharge: 2018-12-18 | Disposition: A | Discharge status: Home / Self Care | Payer: Self-pay | Source: Ambulatory Visit | Attending: Obstetrics and Gynecology | Admitting: Obstetrics and Gynecology

## 2018-12-18 ENCOUNTER — Telehealth: Payer: Self-pay | Admitting: Obstetrics and Gynecology

## 2018-12-18 VITALS — BP 108/67 | HR 61 | Temp 98.0°F | Ht 61.0 in | Wt 147.3 lb

## 2018-12-18 DIAGNOSIS — N879 Dysplasia of cervix uteri, unspecified: Secondary | ICD-10-CM | POA: Insufficient documentation

## 2018-12-18 DIAGNOSIS — N870 Mild cervical dysplasia: Secondary | ICD-10-CM

## 2018-12-18 NOTE — Procedures (Signed)
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  The patient was placed in the dorsal lithotomy position. A Graves was speculum inserted in the vagina, and the cervix was visualized.  AA staining done Lugol's with green filter.  Biopsy 12 and 6 o'clock and then single toothed tenaculum applied and ECC in all four quadrants done. No bleeding after procedure  Findings: diffuse AWE changes at the TZ, mild punctate lesion at 6 o'clock  Adequate: Yes  Specimens: 6 o'clock and random biopsy at 12 o'clock, ECC  Condition: Stable  Complications: None  Plan: The patient was advised to call for any fever or for prolonged or severe pain or bleeding. She was advised to use OTC analgesics as needed for mild to moderate pain. She was advised to avoid vaginal intercourse for 48 hours or until the bleeding has completely stopped.   Durene Romans MD Attending Center for [employer] farm manager)

## 2018-12-18 NOTE — Telephone Encounter (Signed)
Called the patient to complete the pre-screen. The patient answered no to COVID19 symptoms and/or being previously diagnosed. Informed the patient of the wearing a face mask, sanitizing hands at the sanitizing station upon entering our office, and no visitors or children are allowed due to the COVID19 restrictions. The patient verbalized understanding. °

## 2018-12-22 LAB — POCT PREGNANCY, URINE: Preg Test, Ur: NEGATIVE

## 2018-12-23 ENCOUNTER — Encounter: Payer: Self-pay | Admitting: *Deleted

## 2019-01-08 ENCOUNTER — Encounter: Payer: Self-pay | Admitting: Obstetrics and Gynecology

## 2019-01-16 ENCOUNTER — Telehealth: Payer: Self-pay | Admitting: General Practice

## 2019-01-16 NOTE — Telephone Encounter (Signed)
Per Dr Ilda Basset, patient needs repeat pap with HPV testing in 1 year. Called patient, no answer- left message to call us back for results. Will send letter.

## 2019-03-14 ENCOUNTER — Emergency Department (HOSPITAL_COMMUNITY)
Admission: EM | Admit: 2019-03-14 | Discharge: 2019-03-14 | Disposition: A | Discharge status: Home / Self Care | Payer: Self-pay | Attending: Emergency Medicine | Admitting: Emergency Medicine

## 2019-03-14 ENCOUNTER — Emergency Department (HOSPITAL_COMMUNITY): Payer: Self-pay

## 2019-03-14 ENCOUNTER — Other Ambulatory Visit: Payer: Self-pay

## 2019-03-14 ENCOUNTER — Encounter (HOSPITAL_COMMUNITY): Payer: Self-pay | Admitting: *Deleted

## 2019-03-14 DIAGNOSIS — Z79899 Other long term (current) drug therapy: Secondary | ICD-10-CM | POA: Insufficient documentation

## 2019-03-14 DIAGNOSIS — Y908 Blood alcohol level of 240 mg/100 ml or more: Secondary | ICD-10-CM | POA: Insufficient documentation

## 2019-03-14 DIAGNOSIS — F10920 Alcohol use, unspecified with intoxication, uncomplicated: Secondary | ICD-10-CM

## 2019-03-14 DIAGNOSIS — R569 Unspecified convulsions: Secondary | ICD-10-CM | POA: Insufficient documentation

## 2019-03-14 DIAGNOSIS — F10929 Alcohol use, unspecified with intoxication, unspecified: Secondary | ICD-10-CM | POA: Insufficient documentation

## 2019-03-14 LAB — COMPREHENSIVE METABOLIC PANEL
ALT: 31 U/L (ref 0–44)
AST: 24 U/L (ref 15–41)
Albumin: 4.3 g/dL (ref 3.5–5.0)
Alkaline Phosphatase: 48 U/L (ref 38–126)
Anion gap: 12 (ref 5–15)
BUN: 10 mg/dL (ref 6–20)
CO2: 19 mmol/L — ABNORMAL LOW (ref 22–32)
Calcium: 8.7 mg/dL — ABNORMAL LOW (ref 8.9–10.3)
Chloride: 110 mmol/L (ref 98–111)
Creatinine, Ser: 0.52 mg/dL (ref 0.44–1.00)
GFR calc Af Amer: >60 mL/min (ref >60)
GFR calc non Af Amer: >60 mL/min (ref >60)
Glucose, Bld: 96 mg/dL (ref 70–99)
Potassium: 3.4 mmol/L — ABNORMAL LOW (ref 3.5–5.1)
Sodium: 141 mmol/L (ref 135–145)
Total Bilirubin: 0.5 mg/dL (ref 0.3–1.2)
Total Protein: 7 g/dL (ref 6.5–8.1)

## 2019-03-14 LAB — RAPID URINE DRUG SCREEN, HOSP PERFORMED
Amphetamines: NOT DETECTED
Barbiturates: NOT DETECTED
Benzodiazepines: POSITIVE — AB
Cocaine: NOT DETECTED
Opiates: NOT DETECTED
Tetrahydrocannabinol: NOT DETECTED

## 2019-03-14 LAB — CBC WITH DIFFERENTIAL/PLATELET
Abs Immature Granulocytes: 0.01 10*3/uL (ref 0.00–0.07)
Basophils Absolute: 0 10*3/uL (ref 0.0–0.1)
Basophils Relative: 0 %
Eosinophils Absolute: 0.1 10*3/uL (ref 0.0–0.5)
Eosinophils Relative: 2 %
HCT: 37.7 % (ref 36.0–46.0)
Hemoglobin: 12.2 g/dL (ref 12.0–15.0)
Immature Granulocytes: 0 %
Lymphocytes Relative: 38 %
Lymphs Abs: 2.2 10*3/uL (ref 0.7–4.0)
MCH: 29.4 pg (ref 26.0–34.0)
MCHC: 32.4 g/dL (ref 30.0–36.0)
MCV: 90.8 fL (ref 80.0–100.0)
Monocytes Absolute: 0.3 10*3/uL (ref 0.1–1.0)
Monocytes Relative: 5 %
Neutro Abs: 3.1 10*3/uL (ref 1.7–7.7)
Neutrophils Relative %: 55 %
Platelets: 319 10*3/uL (ref 150–400)
RBC: 4.15 MIL/uL (ref 3.87–5.11)
RDW: 13.2 % (ref 11.5–15.5)
WBC: 5.7 10*3/uL (ref 4.0–10.5)
nRBC: 0 % (ref 0.0–0.2)

## 2019-03-14 LAB — ETHANOL: Alcohol, Ethyl (B): 247 mg/dL — ABNORMAL HIGH (ref <10)

## 2019-03-14 LAB — I-STAT BETA HCG BLOOD, ED (MC, WL, AP ONLY): I-stat hCG, quantitative: <5 m[IU]/mL (ref <5)

## 2019-03-14 LAB — SALICYLATE LEVEL: Salicylate Lvl: <7 mg/dL (ref 2.8–30.0)

## 2019-03-14 LAB — ACETAMINOPHEN LEVEL: Acetaminophen (Tylenol), Serum: <10 ug/mL — ABNORMAL LOW (ref 10–30)

## 2019-03-14 LAB — CBG MONITORING, ED: Glucose-Capillary: 93 mg/dL (ref 70–99)

## 2019-03-14 MED ORDER — SODIUM CHLORIDE 0.9 % IV BOLUS
1000.0000 mL | Freq: Once | INTRAVENOUS | Status: AC
  Administered 2019-03-14: 20:00:00 1000 mL via INTRAVENOUS

## 2019-03-14 MED ORDER — SODIUM CHLORIDE 0.9 % IV SOLN: INTRAVENOUS | Status: DC

## 2019-03-14 NOTE — ED Notes (Signed)
Discharge instructions reviewed, pt and family have no further questions.

## 2019-03-14 NOTE — ED Notes (Signed)
Pt assisted to the restroom, urinated with out issue.

## 2019-03-14 NOTE — Discharge Instructions (Addendum)
Do not drive or operate machinery until you are seen by neurology or your doctor due to concern for possible seizure today.

## 2019-03-14 NOTE — ED Provider Notes (Signed)
[place] EMERGENCY DEPARTMENT Provider Note   CSN: [id number] Arrival date & time: 03/14/19  [place]     History   Chief Complaint Chief Complaint  Patient presents with   Alcohol Problem    PT drank 2 bottles of wine today    HPI Summer Wilkinson is a 33 y.o. female.     33yo female without significant past medical history brought in by EMS from home. Per EMS, history from husband at home- patient had 1 bottle of wine today and had opened a second bottle when husband got into the shower. When husband got out of the shower he found the patient unresponsive and called 911. On EMS arrival, patient had 1 episode where her arms drew in and she became tight with gurgling from her mouth lasting approximately 30 seconds. Patient had a second similar episode in the ambulance and was given Versed.      Past Medical History:  Diagnosis Date   Chlamydia    Gallbladder sludge     Patient Active Problem List   Diagnosis Date Noted   Cervical dysplasia 12/18/2018   Screening breast examination 12/11/2018   Low grade squamous intraepithelial lesion (LGSIL) on cervical Pap smear 12/11/2018   Premature cervical dilation, third trimester 08/31/2018   Preterm delivery 08/31/2018   Normal labor 12/02/2016    Past Surgical History:  Procedure Laterality Date   NO PAST SURGERIES       OB History    Gravida  5   Para  3   Term  3   Preterm  0   AB  1   Living  4     SAB  1   TAB  0   Ectopic  0   Multiple      Live Births  4            Home Medications    Prior to Admission medications   Medication Sig Start Date End Date Taking? Authorizing Provider  ibuprofen (ADVIL,MOTRIN) 600 MG tablet Take 1 tablet (600 mg total) by mouth every 6 (six) hours. Patient not taking: Reported on 12/11/2018 09/01/18   Mina Marble P, DO  medroxyPROGESTERone (DEPO-PROVERA) 150 MG/ML injection Inject 150 mg into the muscle every 3 (three)  months.    [provider]  Prenatal Multivit-Min-Fe-FA (PRENATAL VITAMINS PO) Take 1 tablet by mouth daily.     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  HENT:     Head: Normocephalic and atraumatic.     Nose: Nose  normal.     Mouth/Throat:     Mouth: Mucous membranes are moist.  Eyes:     Conjunctiva/sclera:     Right eye: Right conjunctiva is injected.     Left eye: Left conjunctiva is injected.     Pupils: Pupils are equal, round, and reactive to light.     Comments: Pupils 2mm, equal  Neck:     Musculoskeletal: Neck supple.  Cardiovascular:     Rate and Rhythm: Normal rate and regular rhythm.     Pulses: Normal pulses.     Heart sounds: Normal heart sounds.  Pulmonary:     Effort: Pulmonary effort is normal.     Breath sounds: Normal breath sounds.  Abdominal:     Palpations: Abdomen is soft.     Tenderness: There is no abdominal tenderness.  Musculoskeletal:        General: No swelling, tenderness or signs of injury.     Right lower leg: No edema.     Left lower leg: No edema.  Skin:    General: Skin is warm and dry.     Findings: No erythema or rash.  Neurological:     Comments: Withdraws with painful stimuli       ED Treatments / Results   Labs (all labs ordered are listed, but only abnormal results are displayed) Labs Reviewed  COMPREHENSIVE METABOLIC PANEL - Abnormal; Notable for the following components:      Result Value   Potassium 3.4 (*)    CO2 19 (*)    Calcium 8.7 (*)    All other components within normal limits  ACETAMINOPHEN LEVEL - Abnormal; Notable for the following components:   Acetaminophen (Tylenol), Serum <10 (*)    All other components within normal limits  ETHANOL - Abnormal; Notable for the following components:   Alcohol, Ethyl (B) 247 (*)    All other components within normal limits  RAPID URINE DRUG SCREEN, HOSP PERFORMED - Abnormal; Notable for the following components:   Benzodiazepines POSITIVE (*)    All other components within normal limits  SALICYLATE LEVEL  CBC WITH DIFFERENTIAL/PLATELET  CBG MONITORING, ED  I-STAT BETA HCG BLOOD, ED ([facility], [facility], [facility] ONLY)    EKG None EKG: normal EKG, normal sinus rhythm, rate 65, there are no previous tracings available for comparison.  Radiology Ct Head Wo Contrast  Result Date: 03/14/2019 CLINICAL DATA:  EMS reported Pt had 2 possible seizure episodes described as ridged. Pt does not have a HX of seizures and is not on any Meds. Seizure, new, etoh/drug related, nontraumatic EXAM: CT HEAD WITHOUT CONTRAST TECHNIQUE: Contiguous axial images were obtained from the base of the skull through the vertex without intravenous contrast. COMPARISON:  Head CT 07/22/2011 FINDINGS: Brain: No acute intracranial hemorrhage. No focal mass lesion. No CT evidence of acute infarction. No midline shift or mass effect. No hydrocephalus. Basilar cisterns are patent. Vascular: No hyperdense vessel or unexpected calcification. Skull: Normal. Negative for fracture or focal lesion. Sinuses/Orbits: Paranasal sinuses and mastoid air cells are clear. Orbits are clear. Other: None. IMPRESSION: Normal head CT. Electronically Signed   By: Genevive BiStewart  Edmunds M.D.   On: 03/14/2019 19:33     Procedures Procedures (including critical care time)  Medications Ordered in ED Medications  sodium chloride 0.9 % bolus 1,000 mL (1,000 mLs Intravenous New Bag/Given 03/14/19 1957)    And  0.9 %  sodium chloride infusion (has no administration in time range)     Initial Impression /  Assessment and Plan / ED Course  I have reviewed the triage vital signs and the nursing notes.  Pertinent labs & imaging results that were available during my care of the patient were reviewed by me and considered in my medical decision making (see chart for details).  Clinical Course as of Mar 14 2315  Sat Mar 14, 2019  5742 33 year old female brought in by EMS, found unresponsive by her husband after drinking 1-2 bottles of wine tonight.  Question of body tensing/seizure-like behavior witnessed by EMS and patient was given Versed by EMS.  Patient arrives to the emergency room somnolent, withdraws from painful stimuli, will moan.  CT head is unremarkable, lab work is unremarkable with exception of alcohol of 247, urine drug screen positive for benzos likely given by EMS.  On recheck, patient is still sleeping, vitals stable.  Patient's husband is at bedside, states he plans to stay the night and wait for her to wake up.  Plan is to monitor patient until she is awake and ready for discharge.   [LM]  2315 Care signed out to Dr. Eudelia Bunch, ER attending, at change of shift.   [LM]    Clinical Course User Index [LM] Jeannie Fend, PA-C      Final Clinical Impressions(s) / ED Diagnoses   Final diagnoses:  Alcoholic intoxication without complication [facility])  Seizure-like activity [facility])    ED Discharge Orders    None       Alden Hipp 03/14/19 2316    Gwyneth Sprout, MD 03/15/19 1857

## 2019-03-14 NOTE — ED Triage Notes (Signed)
Husband called EMS for alcohol intoxication after drinking 2 bottles of wine today. Husband found Pt on floor and Pt was unresponsive. EMS reported Pt had 2 possible seizure episodes described as ridged . On arrival to ED Pt sleeping soundly and opens eyes only to verbal stimulation. Pt does not have a HX of seizures and is not on any Meds.

## 2019-03-14 NOTE — ED Provider Notes (Signed)
I assumed care of this patient from Suella Broad, [place].  Please see their note for further details of Hx, PE.  Briefly patient is a 33 y.o. female who presented with alcohol intoxication and possible seizure. Work up reassuring.    Current plan is to MTF.  Patient awake and ambulated. Husband at bedside. Contract for safety. Peer support ordered.  The patient is safe for discharge with strict return precautions.   The patient appears reasonably screened and/or stabilized for discharge and I doubt any other medical condition or other [facility] requiring further screening, evaluation, or treatment in the ED at this time prior to discharge.  Disposition: Discharge  Condition: Good  I have discussed the results, Dx and Tx plan with the patient and husband who expressed understanding and agree(s) with the plan. Discharge instructions discussed at great length. The patient and husband were given strict return precautions who verbalized understanding of the instructions. No further questions at time of discharge.    ED Discharge Orders    None       Follow Up: [place] [address] [place], [place] [phone] Schedule an appointment as soon as possible for a visit        Cardama, Grayce Sessions, MD 03/14/19 2346

## 2019-11-07 IMAGING — CT CT HEAD W/O CM
4 series · 16 of 47 positions shown, 18 images · non-contrast
Comparison: Head CT 07/22/2011

CLINICAL DATA: EMS reported Pt had 2 possible seizure episodes
described as ridged. Pt does not have a HX of seizures and is not on
any Meds. Seizure, new, etoh/drug related, nontraumatic

EXAM:
CT HEAD WITHOUT CONTRAST
TECHNIQUE: Contiguous axial images were obtained from the base of the skull
through the vertex without intravenous contrast.

[Series 3: head without · axial · non-contrast · 0.46mm/px · z∈[-138,-3]mm · 7 of 37 slices shown, 9 images]
[im 5/37  brain]
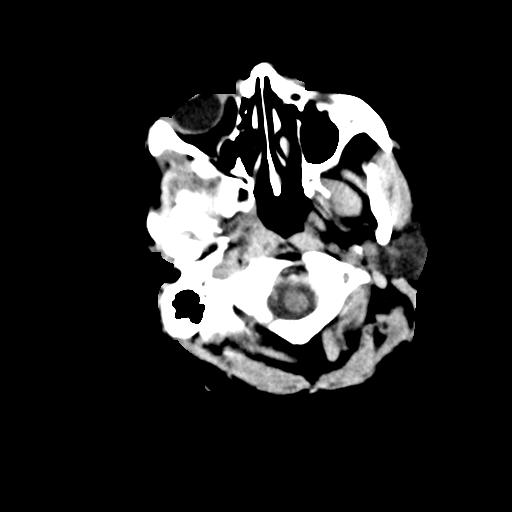
[im 5/37  bone]
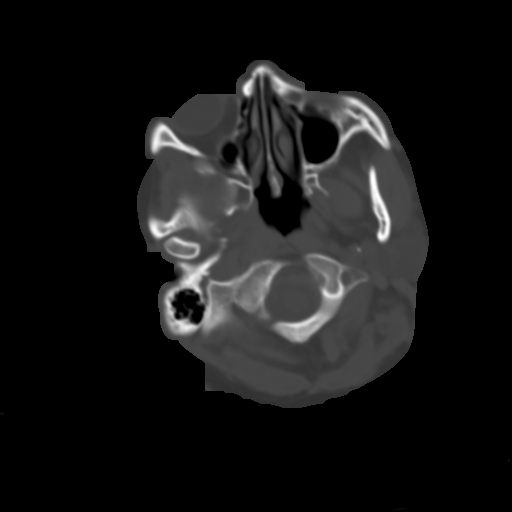
[im 10/37  brain]
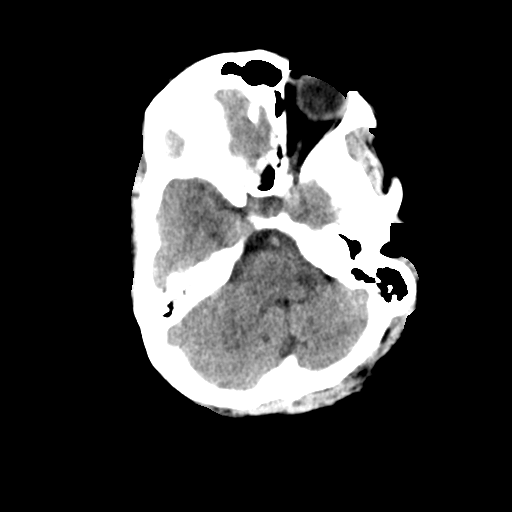
[im 14/37  brain]
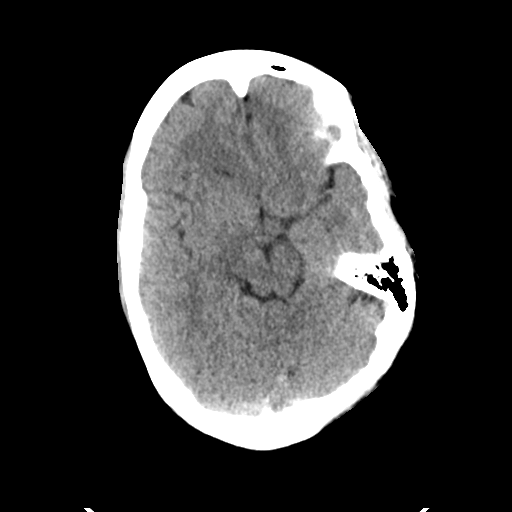
[im 19/37  brain]
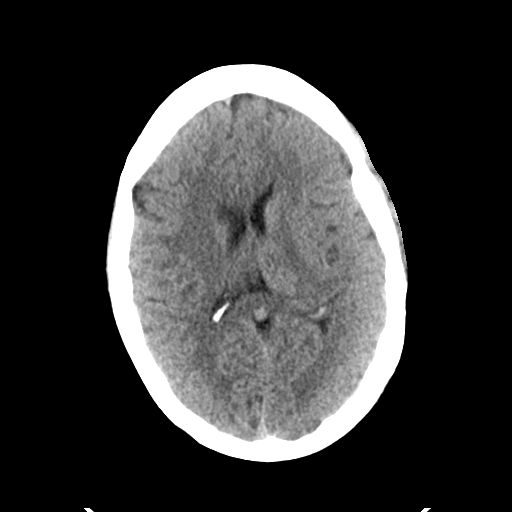
[im 23/37  brain]
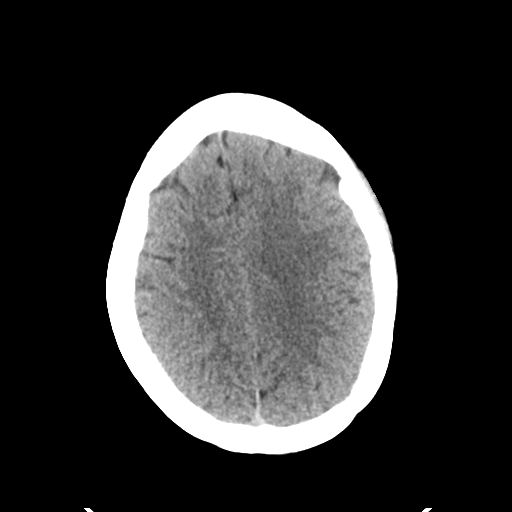
[im 23/37  bone]
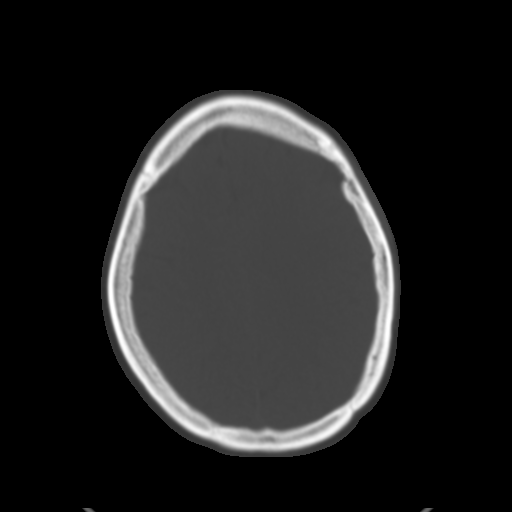
[im 28/37  brain]
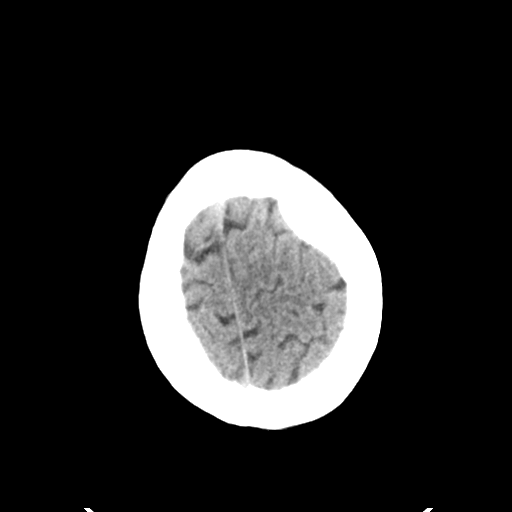
[im 32/37  brain]
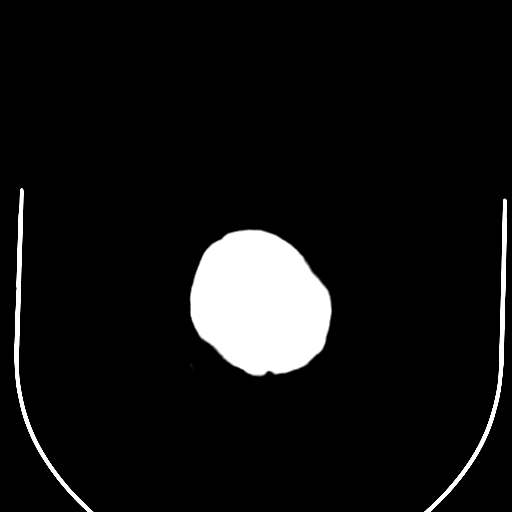

[Series 4: head bone · axial · 0.46mm/px · z∈[-140,-104]mm · 3 of 91 slices shown]
[im 10/91  bone]
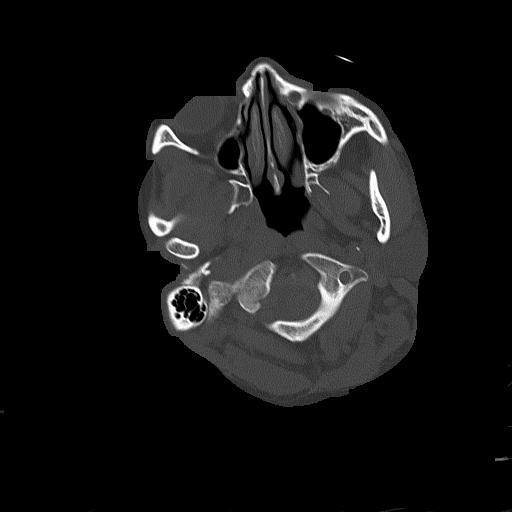
[im 19/91  bone]
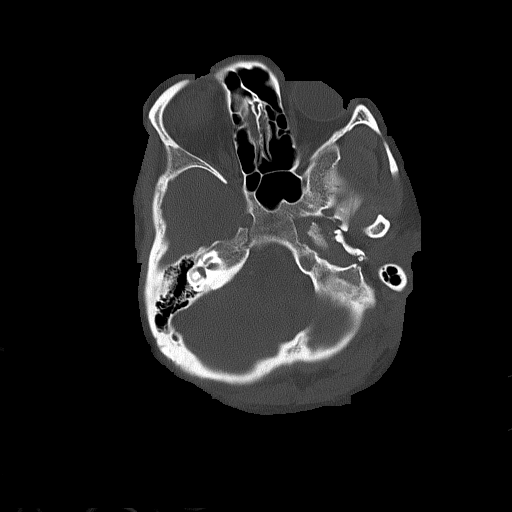
[im 28/91  bone]
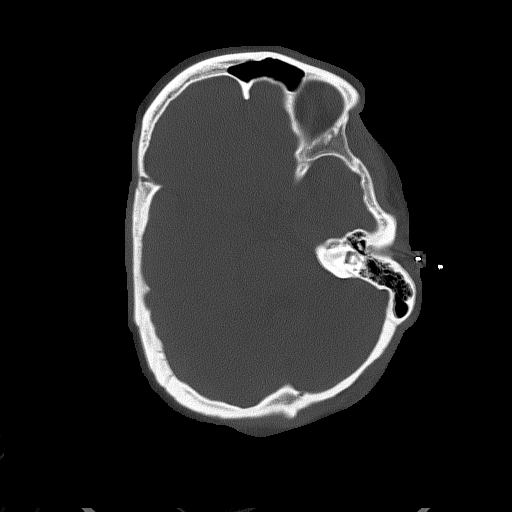

[Series 5: head without cor · coronal · non-contrast · 0.33mm/px · 3 of 73 slices shown]
[im 25/73  brain]
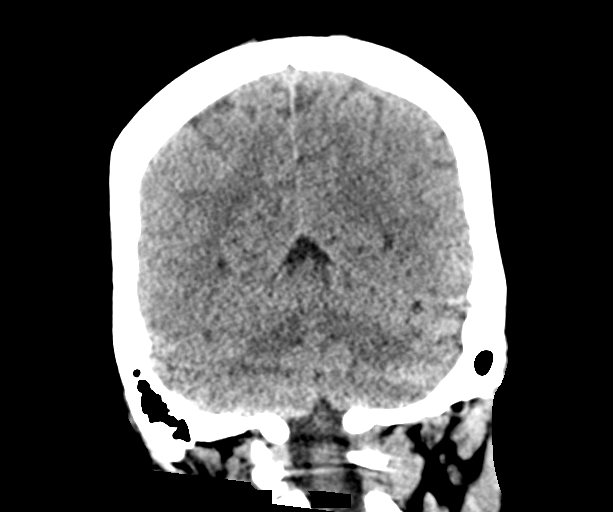
[im 33/73  brain]
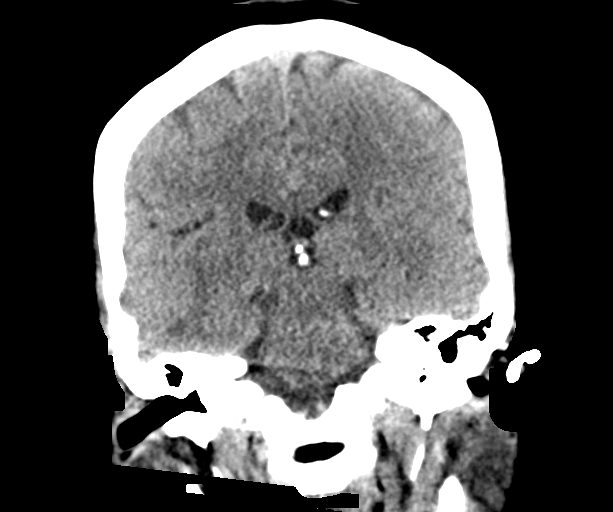
[im 41/73  brain]
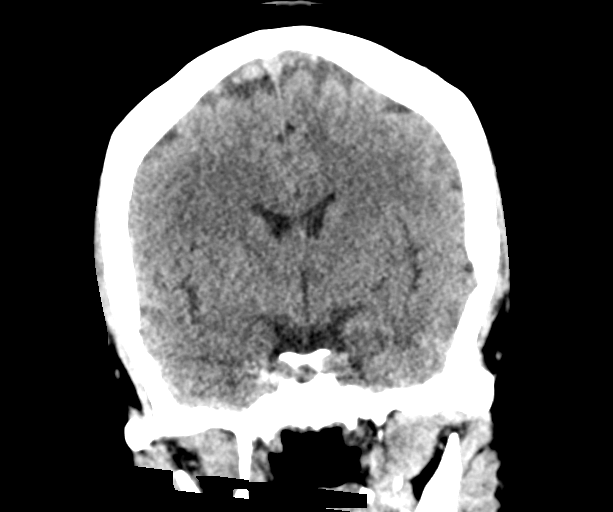

[Series 6: head without sag · sagittal · non-contrast · 0.33mm/px · 3 of 67 slices shown]
[im 28/67  brain]
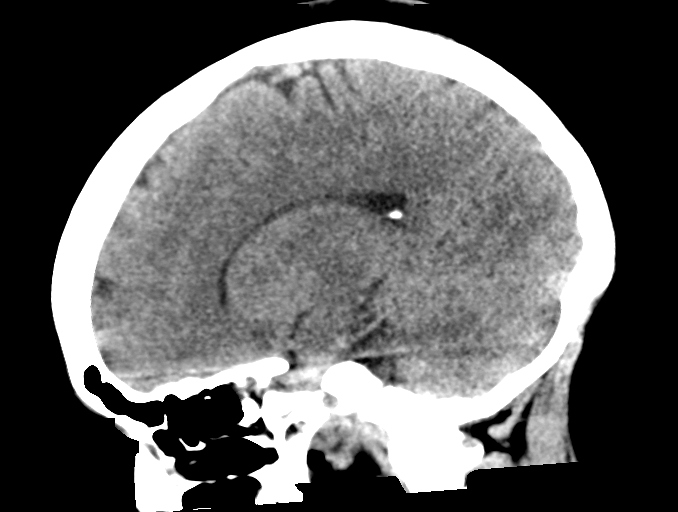
[im 34/67  brain]
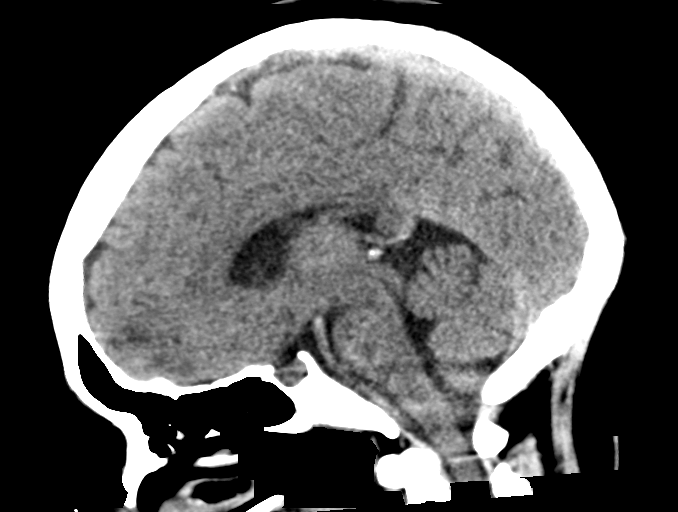
[im 39/67  brain]
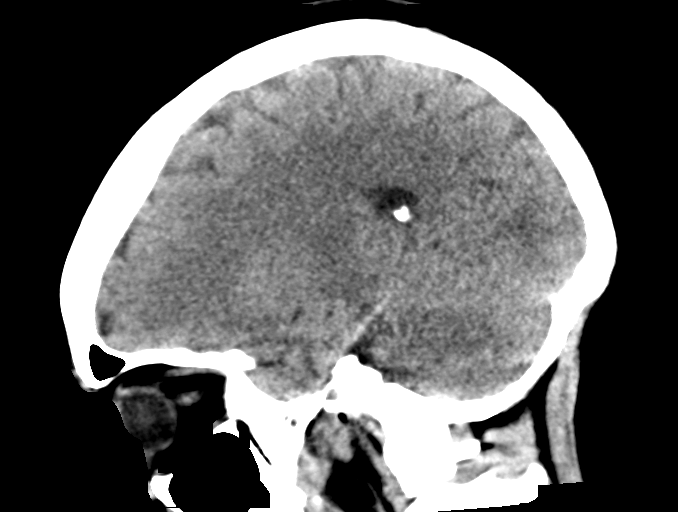

[16 of 47 positions shown; findings below may reference images not displayed]

FINDINGS: Brain: No acute intracranial hemorrhage. No focal mass lesion. No CT
evidence of acute infarction. No midline shift or mass effect. No
hydrocephalus. Basilar cisterns are patent.

Vascular: No hyperdense vessel or unexpected calcification.

Skull: Normal. Negative for fracture or focal lesion.

Sinuses/Orbits: Paranasal sinuses and mastoid air cells are clear.
Orbits are clear.

Other: None.
IMPRESSION: Normal head CT.

## 2019-11-17 ENCOUNTER — Ambulatory Visit: Payer: Self-pay

## 2022-08-14 ENCOUNTER — Ambulatory Visit: Payer: Self-pay | Admitting: Hematology and Oncology

## 2022-08-14 ENCOUNTER — Other Ambulatory Visit (HOSPITAL_COMMUNITY)
Admission: RE | Admit: 2022-08-14 | Discharge: 2022-08-14 | Disposition: A | Discharge status: Home / Self Care | Payer: Self-pay | Source: Ambulatory Visit | Attending: Obstetrics and Gynecology | Admitting: Obstetrics and Gynecology

## 2022-08-14 VITALS — BP 120/82 | Wt 136.7 lb

## 2022-08-14 DIAGNOSIS — B977 Papillomavirus as the cause of diseases classified elsewhere: Secondary | ICD-10-CM | POA: Insufficient documentation

## 2022-08-14 DIAGNOSIS — Z01812 Encounter for preprocedural laboratory examination: Secondary | ICD-10-CM

## 2022-08-14 LAB — PREGNANCY, URINE

## 2022-08-14 NOTE — Progress Notes (Signed)
Ms. PRANAVI TOALSON is a 37 y.o. female who presents to [facility] clinic today with no complaints.    Pap Smear: Pap not smear completed today. Last Pap smear was 03/27/22 and was abnormal - HPV+ . Per patient has history of an abnormal Pap smear. LSIL with HPV+ on 10/21/18 with no follow up.Last Pap smear result is available in Epic.   Physical exam: Breasts Breasts symmetrical. No skin abnormalities bilateral breasts. No nipple retraction bilateral breasts. No nipple discharge bilateral breasts. No lymphadenopathy. No lumps palpated bilateral breasts.       Pelvic/Bimanual Pap is not indicated today   [place] COLPOSCOPY PROCEDURE NOTE  Ms. PAMALLA NAVAL is a 37 y.o. [id number] here for colposcopy for  HPV+  pap smear on 03/27/22. Discussed role for HPV in cervical dysplasia, need for surveillance.  Patient given informed consent, signed copy in the chart, time out was performed.  Placed in lithotomy position. Cervix viewed with speculum and colposcope after application of acetic acid.   Colposcopy adequate? Yes  acetowhite lesion(s) noted at 7 o'clock; biopsies obtained.  ECC specimen obtained. All specimens were labelled and sent to pathology.  Patient was given post procedure instructions.  Will follow up pathology and manage accordingly.  Routine preventative health maintenance measures emphasized.   Dayton Scrape A, NP 08/14/2022 10:41 AM     Smoking History: Patient has never smoked and was not referred to quit line.    Patient Navigation: Patient education provided. Access to services provided for patient through [facility] program. No interpreter provided. No transportation provided   Colorectal Cancer Screening: Per patient has never had colonoscopy completed No complaints today.    Breast and Cervical Cancer Risk Assessment: Patient does not have family history of breast cancer, known genetic mutations, or radiation treatment to  the chest before age 79. Patient does not have history of cervical dysplasia, immunocompromised, or DES exposure in-utero.  Risk Scores   No risk assessment data     A: BCCCP exam without pap smear No complaints with benign exam. Colposcopy as above for HPV+ with history of LSIL.   P: Annual clinical breast exam.  Melodye Ped, NP 08/14/2022 10:41 AM

## 2022-08-16 ENCOUNTER — Telehealth: Payer: Self-pay

## 2022-08-16 LAB — SURGICAL PATHOLOGY

## 2022-08-16 NOTE — Telephone Encounter (Signed)
-----   Message from Melodye Ped, NP sent at 08/16/2022 10:41 AM EST ----- Repeat Pap smear in one year.   Thanks,  Melissa ----- Message ----- From: Royston Bake, CMA Sent: 08/14/2022  11:00 AM EST To: Melodye Ped, NP

## 2022-08-16 NOTE — Telephone Encounter (Signed)
I have left a message for the pt requesting a call back to review her Colpo results.

## 2022-08-20 ENCOUNTER — Telehealth: Payer: Self-pay

## 2022-08-20 NOTE — Telephone Encounter (Signed)
I spoke with the pt and advised of her COLPO results. She understands the recommendation is to repeat her PAP in one year. All questions were answered and pt expressed understanding of this information.

## 2022-08-20 NOTE — Telephone Encounter (Signed)
-----   Message from Melissa A Parsons, NP sent at 08/16/2022 10:41 AM EST ----- Repeat Pap smear in one year.   Thanks,  Melissa ----- Message ----- From: McCauley, Brooke E, CMA Sent: 08/14/2022  11:00 AM EST To: Melissa A Parsons, NP   

## 2023-08-13 ENCOUNTER — Ambulatory Visit: Payer: Self-pay
